# Patient Record
Sex: Male | Born: 1990 | Race: Black or African American | Hispanic: No | Marital: Single | Smoking: Never smoker
Health system: Southern US, Community
[De-identification: ages and names within clinical notes are randomized; demographics above are authoritative.]

## PROBLEM LIST (undated history)

## (undated) DIAGNOSIS — I10 Essential (primary) hypertension: Secondary | ICD-10-CM

## (undated) DIAGNOSIS — M109 Gout, unspecified: Secondary | ICD-10-CM

## (undated) DIAGNOSIS — J302 Other seasonal allergic rhinitis: Secondary | ICD-10-CM

## (undated) HISTORY — PX: WISDOM TOOTH EXTRACTION: SHX21

---
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
## 2012-07-16 ENCOUNTER — Emergency Department (INDEPENDENT_AMBULATORY_CARE_PROVIDER_SITE_OTHER)
Admission: EM | Admit: 2012-07-16 | Discharge: 2012-07-16 | Disposition: A | Discharge status: Home / Self Care | Payer: Self-pay | Source: Home / Self Care

## 2012-07-16 ENCOUNTER — Encounter (HOSPITAL_COMMUNITY): Payer: Self-pay | Admitting: Emergency Medicine

## 2012-07-16 DIAGNOSIS — W57XXXA Bitten or stung by nonvenomous insect and other nonvenomous arthropods, initial encounter: Secondary | ICD-10-CM

## 2012-07-16 DIAGNOSIS — T148 Other injury of unspecified body region: Secondary | ICD-10-CM

## 2012-07-16 DIAGNOSIS — T7840XA Allergy, unspecified, initial encounter: Secondary | ICD-10-CM

## 2012-07-16 HISTORY — DX: Essential (primary) hypertension: I10

## 2012-07-16 MED ORDER — PERMETHRIN 5 % EX CREA: TOPICAL_CREAM | CUTANEOUS | Status: DC

## 2012-07-16 MED ORDER — TRIAMCINOLONE ACETONIDE 0.1 % EX CREA: TOPICAL_CREAM | Freq: Two times a day (BID) | CUTANEOUS | Status: DC

## 2012-07-16 NOTE — ED Provider Notes (Signed)
History     CSN: [id number]  Arrival date & time 07/16/12  1126   First MD Initiated Contact with Patient 07/16/12 1230      Chief Complaint  Patient presents with  . Allergic Reaction    (Consider location/radiation/quality/duration/timing/severity/associated sxs/prior treatment) HPI Comments: 22 year old male presents with a two-day history of itchy red papules all the upper extremities. He states this started after he had helped his older family member moving furniture in her house. She told him that she had recently discovered bed bugs in her house. The patient has red slightly raised and pruritic lesions to the upper extremities. There are no pustules. There are no other lesions to the torso, head, neck or lower extremities. He denies constitutional symptoms and states he feels generally well. He has been applying witch hazel and OTC hydrocortisone with modest relief.   Past Medical History  Diagnosis Date  . Hypertension     History reviewed. No pertinent past surgical history.  No family history on file.  History  Substance Use Topics  . Smoking status: Never Smoker   . Smokeless tobacco: Not on file  . Alcohol Use: No      Review of Systems  All other systems reviewed and are negative.    Allergies  Review of patient's allergies indicates not on file.  Home Medications   Current Outpatient Rx  Name  Route  Sig  Dispense  Refill  . permethrin (ELIMITE) 5 % cream      Apply to neck and body once. Rinse off 8 hours.   60 g   0   . triamcinolone cream (KENALOG) 0.1 %   Topical   Apply topically 2 (two) times daily.   30 g   0     BP 150/98  Pulse 61  Temp(Src) 97.4 F (36.3 C) (Oral)  Resp 20  SpO2 99%  Physical Exam  Nursing note and vitals reviewed. Constitutional: He is oriented to person, place, and time. He appears well-developed and well-nourished. No distress.  Eyes: Conjunctivae and EOM are normal.  Neck: Normal range of motion.  Neck supple.  Cardiovascular: Normal rate.   Pulmonary/Chest: Effort normal.  Musculoskeletal: Normal range of motion.  Neurological: He is alert and oriented to person, place, and time.  Skin: Skin is warm and dry.  See description in distribution of lesions in the history of present illness.  Psychiatric: He has a normal mood and affect.    ED Course  Procedures (including critical care time)  Labs Reviewed - No data to display No results found.   1. Insect bites   2. Allergy to mites, initial encounter       MDM  Patient is discharged in stable condition. He has no constitutional symptoms. We will treat with Elimite cream for 1 application. Triamcinolone 0.1% cream to apply to bites twice a day. He may take Benadryl 50 mg (his choice) every 4-6 hours when necessary itching Clean and close and closing in case you have any this patient .                     Hayden Rasmussen, NP 07/16/12 [phone]

## 2012-07-16 NOTE — ED Provider Notes (Signed)
Medical screening examination/treatment/procedure(s) were performed by resident physician or non-physician practitioner and as supervising physician I was immediately available for consultation/collaboration.   KINDL,JAMES DOUGLAS MD.   James D Kindl, MD 07/16/12 1511 

## 2012-07-16 NOTE — ED Notes (Signed)
C/o rash, allergic reaction since Sunday.  Patient has been using hydrocortisone 1% since yesterday

## 2013-05-27 ENCOUNTER — Encounter (HOSPITAL_COMMUNITY): Payer: Self-pay | Admitting: Emergency Medicine

## 2013-05-27 ENCOUNTER — Emergency Department (HOSPITAL_COMMUNITY): Payer: BC Managed Care – PPO

## 2013-05-27 ENCOUNTER — Emergency Department (HOSPITAL_COMMUNITY)
Admission: EM | Admit: 2013-05-27 | Discharge: 2013-05-27 | Disposition: A | Discharge status: Home / Self Care | Payer: BC Managed Care – PPO | Attending: Emergency Medicine | Admitting: Emergency Medicine

## 2013-05-27 DIAGNOSIS — R0789 Other chest pain: Secondary | ICD-10-CM | POA: Insufficient documentation

## 2013-05-27 DIAGNOSIS — R079 Chest pain, unspecified: Secondary | ICD-10-CM

## 2013-05-27 DIAGNOSIS — I10 Essential (primary) hypertension: Secondary | ICD-10-CM | POA: Insufficient documentation

## 2013-05-27 LAB — CBC
HCT: 48 % (ref 39.0–52.0)
Hemoglobin: 16.4 g/dL (ref 13.0–17.0)
MCHC: 34.2 g/dL (ref 30.0–36.0)
MCV: 85.3 fL (ref 78.0–100.0)
RDW: 13 % (ref 11.5–15.5)
WBC: 8.8 10*3/uL (ref 4.0–10.5)

## 2013-05-27 LAB — BASIC METABOLIC PANEL
BUN: 15 mg/dL (ref 6–23)
Chloride: 98 mEq/L (ref 96–112)
Creatinine, Ser: 0.94 mg/dL (ref 0.50–1.35)
GFR calc Af Amer: >90 mL/min (ref >90)
Glucose, Bld: 88 mg/dL (ref 70–99)
Potassium: 4.8 mEq/L (ref 3.7–5.3)

## 2013-05-27 MED ORDER — IBUPROFEN 800 MG PO TABS: 800.0000 mg | ORAL_TABLET | Freq: Three times a day (TID) | ORAL | Status: DC | PRN

## 2013-05-27 NOTE — ED Provider Notes (Signed)
Medical screening examination/treatment/procedure(s) were performed by non-physician practitioner and as supervising physician I was immediately available for consultation/collaboration.  EKG Interpretation    Date/Time:  Tuesday May 27 2013 17:58:47 EST Ventricular Rate:  80 PR Interval:  141 QRS Duration: 88 QT Interval:  350 QTC Calculation: 404 R Axis:   18 Text Interpretation:  Sinus rhythm Normal ECG No previous tracing Confirmed by   MD-J,  ([id number]) on 05/27/2013 6:07:44 PM              Celene Kras, MD 05/27/13 [phone]

## 2013-05-27 NOTE — ED Provider Notes (Signed)
CSN: [id number]     Arrival date & time 05/27/13  1725 History   First MD Initiated Contact with Patient 05/27/13 1756     Chief Complaint  Patient presents with  . Chest Pain   (Consider location/radiation/quality/duration/timing/severity/associated sxs/prior Treatment) HPI Comments: Carlos Oneal is a 22 y.o. year-old male with a past medical history of HTN, presenting the Emergency Department with a chief complaint of 3 episodes of chest pain since yesterday.  The patient describes the discomfort as non-radiating, substernal, sharp pain lasting 35-45 seconds.  He reports onset of lifting things above his head at work and while drive and turning the steering wheel.  Denis palpations or missed beats. He denies nausea, dyspnea, cough, or abdominal pain. Reports negative family history of MI in father or mother, denies history of DM.  Denies cocaine use, reports occasional EtOH use. No PCP   The history is provided by the patient. No language interpreter was used.    Past Medical History  Diagnosis Date  . Hypertension    Past Surgical History  Procedure Laterality Date  . Wisdom tooth extraction     No family history on file. History  Substance Use Topics  . Smoking status: Never Smoker   . Smokeless tobacco: Not on file  . Alcohol Use: Yes     Comment: occasional    Review of Systems  Constitutional: Negative for fever, chills and diaphoresis.  Respiratory: Positive for chest tightness. Negative for cough, shortness of breath and wheezing.   Cardiovascular: Positive for chest pain. Negative for palpitations and leg swelling.  Gastrointestinal: Negative for nausea, vomiting, abdominal pain, diarrhea and constipation.  Neurological: Negative for syncope, light-headedness and numbness.    Allergies  Review of patient's allergies indicates no known allergies.  Home Medications   Current Outpatient Rx  Name  Route  Sig  Dispense  Refill  . ibuprofen (ADVIL,MOTRIN)  800 MG tablet   Oral   Take 1 tablet (800 mg total) by mouth 3 (three) times daily with meals as needed.   21 tablet   0    BP 149/90  Pulse 67  Temp(Src) 98.5 F (36.9 C) (Oral)  Resp 16  Ht 6\' 2"  (1.88 m)  Wt 270 lb (122.471 kg)  BMI 34.65 kg/m2  SpO2 100% Physical Exam  Nursing note and vitals reviewed. Constitutional: He is oriented to person, place, and time. He appears well-developed and well-nourished.  HENT:  Head: Normocephalic and atraumatic.  Neck: Neck supple.  Cardiovascular: Normal rate, regular rhythm and normal heart sounds.   Pulmonary/Chest: Effort normal and breath sounds normal. He has no wheezes. He has no rales.  Abdominal: Soft. There is no tenderness.  Neurological: He is alert and oriented to person, place, and time.  Skin: Skin is warm.    ED Course  Procedures (including critical care time) Labs Review Labs Reviewed  CBC  BASIC METABOLIC PANEL  POCT I-STAT TROPONIN I   Imaging Review Dg Chest 2 View  05/27/2013   CLINICAL DATA:  Mid left chest pain  EXAM: CHEST  2 VIEW  COMPARISON:  None.  FINDINGS: The heart size and mediastinal contours are within normal limits. Both lungs are clear. The visualized skeletal structures are unremarkable.  IMPRESSION: No active cardiopulmonary disease.   Electronically Signed   By: Elige Ko   On: 05/27/2013 19:03    EKG Interpretation    Date/Time:  Tuesday May 27 2013 17:58:47 EST Ventricular Rate:  80 PR Interval:  141 QRS  Duration: 88 QT Interval:  350 QTC Calculation: 404 R Axis:   18 Text Interpretation:  Sinus rhythm Normal ECG No previous tracing Confirmed by KNAPP  MD-J, JON ([id number]) on 05/27/2013 6:07:44 PM            MDM   1. Chest pain    Pt with 3 episodes of chest pain lasting 35-45 seconds.  Likely atypical chest pain. Discussed patient history, condition, and labs with Dr. Lynelle Doctor who agrees the patient can be evaluated as an out-pt. Discussed lab results, imaging results,  and treatment plan with the patient. Return precautions given. Reports understanding and no other concerns at this time.  Patient is stable for discharge at this time.  Meds given in ED:  Medications - No data to display  Discharge Medication List as of 05/27/2013  7:32 PM    START taking these medications   Details  ibuprofen (ADVIL,MOTRIN) 800 MG tablet Take 1 tablet (800 mg total) by mouth 3 (three) times daily with meals as needed., Starting 05/27/2013, Until Discontinued, Print          Clabe Seal, PA-C 05/27/13 1948

## 2013-05-27 NOTE — ED Notes (Signed)
Pt reports onset central chest pain/ tenderness yesterday at work while pt was moving around, pain went away after work. Pain came back today while pt was at work. Pt reports chest feels "sore and tender." Denies SOB, no N/V.

## 2013-08-20 ENCOUNTER — Encounter (HOSPITAL_COMMUNITY): Payer: Self-pay | Admitting: Emergency Medicine

## 2013-08-20 ENCOUNTER — Emergency Department (HOSPITAL_COMMUNITY)
Admission: EM | Admit: 2013-08-20 | Discharge: 2013-08-20 | Disposition: A | Discharge status: Home / Self Care | Payer: BC Managed Care – PPO | Attending: Emergency Medicine | Admitting: Emergency Medicine

## 2013-08-20 DIAGNOSIS — L723 Sebaceous cyst: Secondary | ICD-10-CM

## 2013-08-20 DIAGNOSIS — I10 Essential (primary) hypertension: Secondary | ICD-10-CM | POA: Insufficient documentation

## 2013-08-20 NOTE — ED Notes (Signed)
Pt presents with c/o lower and upper left back pain. Pt says earlier today he felt a knot in the middle of his back, new to him. Pt denies any injury to his back but he does heavy labor for work.

## 2013-08-20 NOTE — ED Provider Notes (Signed)
CSN: [id number]     Arrival date & time 08/20/13  1341 History   Carlos chart was scribed for non-physician practitioner working with Marlon Peliffany , by Tana ConchStephen Methvin ED Scribe. Carlos Oneal was seen in room WTR8/WTR8 and the Oneal's care was started at 2:15 PM.    Chief Complaint  Oneal presents with  . Back Pain     The history is provided by the Oneal. No language interpreter was used.   HPI Comments: Carlos Oneal is a Carlos y.o. male who presents to the Emergency Department complaining of a knot in his back that Carlos Oneal discovered Carlos morning. Carlos Oneal reports that Carlos Oneal has associated, constant back pain that has been present since yesterday. Carlos Oneal reports upon finding the knot in his back, Carlos Oneal decided to come into the ED. Carlos Oneal denies tenderness to the "knot". Carlos Oneal works at [employer] where Carlos Oneal does a lot of heavy lifting.    Past Medical History  Diagnosis Date  . Hypertension    Past Surgical History  Procedure Laterality Date  . Wisdom tooth extraction     No family history on file. History  Substance Use Topics  . Smoking status: Never Smoker   . Smokeless tobacco: Not on file  . Alcohol Use: Yes     Comment: occasional    Review of Systems  Constitutional: Positive for activity change. Negative for fever.  Musculoskeletal: Positive for back pain.  All other systems reviewed and are negative.      Allergies  Review of Oneal's allergies indicates no known allergies.  Home Medications   Current Outpatient Rx  Name  Route  Sig  Dispense  Refill  . ibuprofen (ADVIL,MOTRIN) 800 MG tablet   Oral   Take 1 tablet (800 mg total) by mouth 3 (three) times daily with meals as needed.   21 tablet   0    BP 144/86  Pulse 72  Temp(Src) 98.2 F (36.8 C) (Oral)  Resp 17  SpO2 99% Physical Exam  Nursing note and vitals reviewed. Constitutional: Carlos Oneal appears well-developed and well-nourished. No distress.  HENT:  Head: Normocephalic and atraumatic.  Eyes: Pupils are  equal, round, and reactive to light.  Neck: Normal range of motion. Neck supple.  Cardiovascular: Normal rate and regular rhythm.   Pulmonary/Chest: Effort normal.  Abdominal: Soft.  Musculoskeletal:       Back:  Neurological: Carlos Oneal is alert.  Skin: Skin is warm and dry.    ED Course  Procedures (including critical care time)  DIAGNOSTIC STUDIES: Oxygen Saturation is 99% on RA, normal by my interpretation.    COORDINATION OF CARE:  2:18 PM-Discussed treatment plan which includes monitoring the cyst with Carlos Oneal at bedside and Carlos Oneal agreed to plan.      Labs Review Labs Reviewed - No data to display Imaging Review No results found.   EKG Interpretation None      MDM   Final diagnoses:  Sebaceous cyst   Carlos Oneal has some mild musculoskeletal back pain which Carlos Oneal reports getting intermittently. Carlos is not the reasons for his visit. Carlos Oneal is concerned about the knot in his back that Carlos Oneal found. No emergent findings noted. If it starts to fast ly grow, become tender, painful or hot, become "bumpy", then Carlos Oneal needs to be seen ASAP. Given surgery referral if Carlos Oneal wants it removed for cosmetic purposes.  Carlos Oneal's evaluation in the Emergency Department is complete. It has been determined that no acute conditions requiring further emergency intervention are present  at Carlos time. The Oneal/guardian have been advised of the diagnosis and plan. We have discussed signs and symptoms that warrant return to the ED, such as changes or worsening in symptoms.  Vital signs are stable at discharge. Filed Vitals:   08/20/13 1405  BP: 144/86  Pulse: 72  Temp: 98.2 F (36.8 C)  Resp: 17    Oneal/guardian has voiced understanding and agreed to follow-up with the PCP or specialist.  I personally performed the services described in Carlos documentation, which was scribed in my presence. The recorded information has been reviewed and is accurate.        Dorthula Matas, PA-C 08/20/13  1428

## 2013-08-20 NOTE — Discharge Instructions (Signed)

## 2013-08-20 NOTE — ED Provider Notes (Signed)
Medical screening examination/treatment/procedure(s) were performed by non-physician practitioner and as supervising physician I was immediately available for consultation/collaboration.   EKG Interpretation None         , MD 08/20/13 1544 

## 2017-01-28 ENCOUNTER — Ambulatory Visit
Admission: EM | Admit: 2017-01-28 | Discharge: 2017-01-28 | Disposition: A | Discharge status: Home / Self Care | Payer: 59 | Attending: Family Medicine | Admitting: Family Medicine

## 2017-01-28 ENCOUNTER — Encounter: Payer: Self-pay | Admitting: *Deleted

## 2017-01-28 DIAGNOSIS — R21 Rash and other nonspecific skin eruption: Secondary | ICD-10-CM | POA: Diagnosis not present

## 2017-01-28 DIAGNOSIS — W57XXXA Bitten or stung by nonvenomous insect and other nonvenomous arthropods, initial encounter: Secondary | ICD-10-CM | POA: Diagnosis not present

## 2017-01-28 MED ORDER — TRIAMCINOLONE ACETONIDE 0.1 % EX CREA: 1.0000 "application " | TOPICAL_CREAM | Freq: Two times a day (BID) | CUTANEOUS | 0 refills | Status: DC

## 2017-01-28 MED ORDER — CETIRIZINE HCL 10 MG PO TABS: 10.0000 mg | ORAL_TABLET | Freq: Every day | ORAL | 0 refills | Status: DC

## 2017-01-28 NOTE — ED Triage Notes (Signed)
Rash to both arms and hips. Onset yesterday.

## 2017-01-28 NOTE — ED Provider Notes (Signed)
[facility]    CSN: [id number] Arrival date & time: 01/28/17  [id number]     History   Chief Complaint Chief Complaint  Patient presents with  . Rash    HPI Carlos Oneal is a 26 y.o. male.   HPI  This a 26 year old male who presents with a bumpy rash that is itchy on both of his arms and over his hips laterally. Started yesterday. He is visiting a friend who has a poodle dog that was rubbing up against him and sitting on his lap and also remembers staying outdoors in a grassy area for a long period of time. Afterwards his when he noted the rash that appeared as bites. Is on both of his arms minimally but more concentrated on both of his hips laterally. There are no further signs on his hands face neck groin lower legs or feet. He has had pediculosis in the past that this is  not like that at all.        Past Medical History:  Diagnosis Date  . Hypertension     There are no active problems to display for this patient.   Past Surgical History:  Procedure Laterality Date  . WISDOM TOOTH EXTRACTION         Home Medications    Prior to Admission medications   Medication Sig Start Date End Date Taking? Authorizing Provider  amLODipine (NORVASC) 10 MG tablet Take 10 mg by mouth daily.   Yes [provider]  cetirizine (ZYRTEC ALLERGY) 10 MG tablet Take 1 tablet (10 mg total) by mouth daily. 01/28/17   Lutricia Feiloemer,  P, PA-C  ibuprofen (ADVIL,MOTRIN) 800 MG tablet Take 1 tablet (800 mg total) by mouth 3 (three) times daily with meals as needed. 05/27/13   Mellody DrownParker, Lauren, PA-C  triamcinolone cream (KENALOG) 0.1 % Apply 1 application topically 2 (two) times daily. 01/28/17   Lutricia Feiloemer,  P, PA-C    Family History History reviewed. No pertinent family history.  Social History Social History  Substance Use Topics  . Smoking status: Never Smoker  . Smokeless tobacco: Never Used  . Alcohol use Yes     Comment: occasional     Allergies     Patient has no known allergies.   Review of Systems Review of Systems  Constitutional: Negative for activity change, chills, fatigue and fever.  Skin: Positive for rash.  All other systems reviewed and are negative.    Physical Exam Triage Vital Signs ED Triage Vitals  Enc Vitals Group     BP 01/28/17 1004 (!) 160/110     Pulse Rate 01/28/17 1002 80     Resp 01/28/17 1002 16     Temp 01/28/17 1002 98.2 F (36.8 C)     Temp Source 01/28/17 1002 Oral     SpO2 01/28/17 1002 99 %     Weight 01/28/17 1003 (!) 340 lb (154.2 kg)     Height 01/28/17 1003 6\' 2"  (1.88 m)     Head Circumference --      Peak Flow --      Pain Score --      Pain Loc --      Pain Edu? --      Excl. in GC? --    No data found.   Updated Vital Signs BP (!) 160/110 (BP Location: Left Arm)   Pulse 80   Temp 98.2 F (36.8 C) (Oral)   Resp 16   Ht 6\' 2"  (1.88 m)  Wt (!) 340 lb (154.2 kg)   SpO2 99%   BMI 43.65 kg/m   Visual Acuity Right Eye Distance:   Left Eye Distance:   Bilateral Distance:    Right Eye Near:   Left Eye Near:    Bilateral Near:     Physical Exam  Constitutional: He is oriented to person, place, and time. He appears well-developed and well-nourished. No distress.  HENT:  Head: Normocephalic.  Eyes: Pupils are equal, round, and reactive to light.  Neck: Normal range of motion.  Musculoskeletal: Normal range of motion.  Neurological: He is alert and oriented to person, place, and time.  Skin: Skin is warm and dry. Rash noted. He is not diaphoretic. There is erythema.  Refer to attached photographs for detail. The diffusion is on both lateral hips and on his arms not above the elbow. There is no involvement of his hands. He has no interdigital lesions. There is no burrowing seen  Psychiatric: He has a normal mood and affect. His behavior is normal. Judgment and thought content normal.  Nursing note and vitals reviewed.            UC Treatments / Results   Labs (all labs ordered are listed, but only abnormal results are displayed) Labs Reviewed - No data to display  EKG  EKG Interpretation None       Radiology No results found.  Procedures Procedures (including critical care time)  Medications Ordered in UC Medications - No data to display   Initial Impression / Assessment and Plan / UC Course  I have reviewed the triage vital signs and the nursing notes.  Pertinent labs & imaging results that were available during my care of the patient were reviewed by me and considered in my medical decision making (see chart for details).     Plan: 1. Test/x-ray results and diagnosis reviewed with patient 2. rx as per orders; risks, benefits, potential side effects reviewed with patient 3. Recommend supportive treatment with topical triamcinolone cream and also oral tech for itching.  If It is not improving in a week or so he should follow-up with a dermatologist. 4. F/u prn if symptoms worsen or don't improve   Final Clinical Impressions(s) / UC Diagnoses   Final diagnoses:  Insect bite, initial encounter    New Prescriptions Discharge Medication List as of 01/28/2017 10:33 AM    START taking these medications   Details  cetirizine (ZYRTEC ALLERGY) 10 MG tablet Take 1 tablet (10 mg total) by mouth daily., Starting Sun 01/28/2017, Normal    triamcinolone cream (KENALOG) 0.1 % Apply 1 application topically 2 (two) times daily., Starting Sun 01/28/2017, Normal         Controlled Substance Prescriptions [place] Controlled Substance Registry consulted? Not Applicable   Lutricia Feil, PA-C 01/28/17 1708

## 2017-08-09 ENCOUNTER — Encounter: Payer: Self-pay | Admitting: Emergency Medicine

## 2017-08-09 ENCOUNTER — Other Ambulatory Visit: Payer: Self-pay

## 2017-08-09 ENCOUNTER — Ambulatory Visit
Admission: EM | Admit: 2017-08-09 | Discharge: 2017-08-09 | Disposition: A | Discharge status: Home / Self Care | Payer: 59 | Attending: Family Medicine | Admitting: Family Medicine

## 2017-08-09 DIAGNOSIS — B029 Zoster without complications: Secondary | ICD-10-CM

## 2017-08-09 HISTORY — DX: Other seasonal allergic rhinitis: J30.2

## 2017-08-09 MED ORDER — VALACYCLOVIR HCL 1 G PO TABS: 1000.0000 mg | ORAL_TABLET | Freq: Three times a day (TID) | ORAL | 0 refills | Status: DC

## 2017-08-09 NOTE — ED Provider Notes (Signed)
[facility]    CSN: [id number] Arrival date & time: 08/09/17  1130  History   Chief Complaint Chief Complaint  Patient presents with  . Rash   HPI  27 year old male presents with rash.  Patient states that on Sunday he noticed a rash on the right side of his neck.  The he states that it appears to be "blisters".  Associated pain.  He states that he is recently gotten his hair cut and has been using a beard oil.  No new changes that he is aware of.  No fevers or chills.  No other associated symptoms.  Pain is 7/10 in severity.  No exacerbating/relieving factors.  No other complaints.  Past Medical History:  Diagnosis Date  . Hypertension   . Seasonal allergies    Past Surgical History:  Procedure Laterality Date  . WISDOM TOOTH EXTRACTION      Home Medications    Prior to Admission medications   Medication Sig Start Date End Date Taking? Authorizing Provider  amLODipine (NORVASC) 10 MG tablet Take 10 mg by mouth daily.   Yes [provider]  losartan (COZAAR) 50 MG tablet Take 50 mg by mouth daily.   Yes [provider]  cetirizine (ZYRTEC ALLERGY) 10 MG tablet Take 1 tablet (10 mg total) by mouth daily. 01/28/17   Lutricia Feil, PA-C  valACYclovir (VALTREX) 1000 MG tablet Take 1 tablet (1,000 mg total) by mouth 3 (three) times daily. 08/09/17   Tommie Sams, DO    Family History Family History  Problem Relation Age of Onset  . Healthy Mother   . Hypertension Father   . Deep vein thrombosis Father     Social History Social History   Tobacco Use  . Smoking status: Never Smoker  . Smokeless tobacco: Never Used  Substance Use Topics  . Alcohol use: Yes    Comment: occasional  . Drug use: No     Allergies   Patient has no known allergies.   Review of Systems Review of Systems  Constitutional: Negative.   Skin: Positive for rash.   Physical Exam Triage Vital Signs ED Triage Vitals [08/09/17 1147]  Enc Vitals Group   BP (!) 147/106     Pulse Rate 94     Resp 16     Temp 98.6 F (37 C)     Temp Source Oral     SpO2 99 %     Weight (!) 330 lb (149.7 kg)     Height 6\' 1"  (1.854 m)     Head Circumference      Peak Flow      Pain Score 7     Pain Loc      Pain Edu?      Excl. in GC?    Updated Vital Signs BP (!) 143/76 (BP Location: Right Arm)   Pulse 94   Temp 98.6 F (37 C) (Oral)   Resp 16   Ht 6\' 1"  (1.854 m)   Wt (!) 330 lb (149.7 kg)   SpO2 99%   BMI 43.54 kg/m   Physical Exam  Constitutional: He is oriented to person, place, and time. He appears well-developed. No distress.  Cardiovascular: Normal rate and regular rhythm.  Pulmonary/Chest: Effort normal and breath sounds normal. He has no wheezes. He has no rales.  Neurological: He is alert and oriented to person, place, and time.  Skin:  Right side of the neck and right trapezius region with erythematous, vesicular  rash.  Psychiatric: He has a normal mood and affect. His behavior is normal.  Nursing note and vitals reviewed.  UC Treatments / Results  Labs (all labs ordered are listed, but only abnormal results are displayed) Labs Reviewed - No data to display  EKG  EKG Interpretation None       Radiology No results found.  Procedures Procedures (including critical care time)  Medications Ordered in UC Medications - No data to display   Initial Impression / Assessment and Plan / UC Course  I have reviewed the triage vital signs and the nursing notes.  Pertinent labs & imaging results that were available during my care of the patient were reviewed by me and considered in my medical decision making (see chart for details).     27 year old male presents with rash that appears to be consistent with herpes zoster.  Treating with Valtrex.  Final Clinical Impressions(s) / UC Diagnoses   Final diagnoses:  Herpes zoster without complication    ED Discharge Orders        Ordered    valACYclovir (VALTREX) 1000  MG tablet  3 times daily     08/09/17 1208     Controlled Substance Prescriptions [place] Controlled Substance Registry consulted? Not Applicable   Tommie SamsCook,  G, DO 08/09/17 1233

## 2017-08-09 NOTE — ED Triage Notes (Signed)
Patient in today c/o rash/blisters 2-3 days on the right side of his neck and face.

## 2017-08-10 ENCOUNTER — Telehealth: Payer: Self-pay | Admitting: Family Medicine

## 2017-08-10 MED ORDER — TRAMADOL HCL 50 MG PO TABS: 50.0000 mg | ORAL_TABLET | Freq: Three times a day (TID) | ORAL | 0 refills | Status: DC | PRN

## 2017-08-10 NOTE — Telephone Encounter (Signed)
Patient requesting Rx for pain regarding shingles Rx for tramadol sent.

## 2019-05-04 ENCOUNTER — Ambulatory Visit
Admission: EM | Admit: 2019-05-04 | Discharge: 2019-05-04 | Disposition: A | Discharge status: Home / Self Care | Payer: BC Managed Care – PPO | Attending: Urgent Care | Admitting: Urgent Care

## 2019-05-04 ENCOUNTER — Encounter: Payer: Self-pay | Admitting: Emergency Medicine

## 2019-05-04 ENCOUNTER — Other Ambulatory Visit: Payer: Self-pay

## 2019-05-04 DIAGNOSIS — I10 Essential (primary) hypertension: Secondary | ICD-10-CM

## 2019-05-04 DIAGNOSIS — J029 Acute pharyngitis, unspecified: Secondary | ICD-10-CM

## 2019-05-04 LAB — RAPID STREP SCREEN (MED CTR MEBANE ONLY): Streptococcus, Group A Screen (Direct): NEGATIVE

## 2019-05-04 NOTE — ED Provider Notes (Signed)
[facility]    CSN: [id number] Arrival date & time: 05/04/19  0915      History   Chief Complaint Chief Complaint  Patient presents with  . Sore Throat    HPI Carlos Oneal is a 28 y.o. male.   HPI   28 year old male presents with a sore throat that he has had since Monday 6 days prior to this visit.  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  He has recently returned from [place].  Denies any cough has had no shortness of breath has had no fatigue denies any nasal congestion or sinus drainage.  His only complaint is that of a tickle in his throat.  He does not smoke.  Past Medical History:  Diagnosis Date  . Hypertension   . Seasonal allergies     There are no active problems to display for this patient.   Past Surgical History:  Procedure Laterality Date  . WISDOM TOOTH EXTRACTION         Home Medications    Prior to Admission medications   Medication Sig Start Date End Date Taking? Authorizing Provider  amLODipine (NORVASC) 10 MG tablet Take 10 mg by mouth daily.   Yes [provider]  losartan (COZAAR) 50 MG tablet Take 50 mg by mouth daily.   Yes [provider]  valACYclovir (VALTREX) 1000 MG tablet Take 1 tablet (1,000 mg total) by mouth 3 (three) times daily. 08/09/17   Coral Spikes, DO  cetirizine (ZYRTEC ALLERGY) 10 MG tablet Take 1 tablet (10 mg total) by mouth daily. 01/28/17 05/04/19  Lorin Picket, PA-C    Family History Family History  Problem Relation Age of Onset  . Healthy Mother   . Hypertension Father   . Deep vein thrombosis Father     Social History Social History   Tobacco Use  . Smoking status: Never Smoker  . Smokeless tobacco: Never Used  Substance Use Topics  . Alcohol use: Yes    Comment: occasional  . Drug use: No     Allergies   Patient has no known allergies.   Review of Systems Review of Systems  Constitutional: Negative for activity change, appetite change,  chills, fatigue and fever.  HENT: Positive for sore throat and trouble swallowing. Negative for congestion, ear pain, postnasal drip, rhinorrhea, sinus pressure, sinus pain and voice change.   Respiratory: Negative for cough, shortness of breath, wheezing and stridor.   All other systems reviewed and are negative.    Physical Exam Triage Vital Signs ED Triage Vitals  Enc Vitals Group     BP 05/04/19 0927 (!) 157/108     Pulse Rate 05/04/19 0927 95     Resp 05/04/19 0927 17     Temp 05/04/19 0927 98.7 F (37.1 C)     Temp Source 05/04/19 0927 Oral     SpO2 05/04/19 0927 97 %     Weight 05/04/19 0924 (!) 340 lb (154.2 kg)     Height 05/04/19 0924 6\' 2"  (1.88 m)     Head Circumference --      Peak Flow --      Pain Score 05/04/19 0924 7     Pain Loc --      Pain Edu? --      Excl. in [place]? --    No data found.  Updated Vital Signs BP (!) 143/100 (BP Location: Left Arm)   Pulse 95  Temp 98.7 F (37.1 C) (Oral)   Resp 17   Ht 6\' 2"  (1.88 m)   Wt (!) 340 lb (154.2 kg)   SpO2 97%   BMI 43.65 kg/m   Visual Acuity Right Eye Distance:   Left Eye Distance:   Bilateral Distance:    Right Eye Near:   Left Eye Near:    Bilateral Near:     Physical Exam Vitals signs and nursing note reviewed.  Constitutional:      General: He is not in acute distress.    Appearance: He is well-developed. He is obese. He is not ill-appearing, toxic-appearing or diaphoretic.  HENT:     Head: Normocephalic and atraumatic.     Mouth/Throat:     Mouth: Mucous membranes are moist. No oral lesions.     Pharynx: Oropharynx is clear. Uvula midline. No pharyngeal swelling, oropharyngeal exudate, posterior oropharyngeal erythema or uvula swelling.     Tonsils: No tonsillar exudate or tonsillar abscesses. 0 on the right. 0 on the left.  Eyes:     Conjunctiva/sclera: Conjunctivae normal.  Neck:     Musculoskeletal: Normal range of motion and neck supple.  Cardiovascular:     Rate and Rhythm:  Normal rate and regular rhythm.     Heart sounds: Normal heart sounds.  Pulmonary:     Effort: Pulmonary effort is normal.     Breath sounds: Normal breath sounds.  Skin:    General: Skin is warm and dry.  Neurological:     General: No focal deficit present.     Mental Status: He is alert and oriented to person, place, and time.  Psychiatric:        Mood and Affect: Mood normal.        Behavior: Behavior normal.      UC Treatments / Results  Labs (all labs ordered are listed, but only abnormal results are displayed) Labs Reviewed  RAPID STREP SCREEN ([facility] ONLY)  NOVEL CORONAVIRUS, NAA (HOSP ORDER, SEND-OUT TO REF LAB; TAT 18-24 HRS)  CULTURE, GROUP A STREP [facility])    EKG   Radiology No results found.  Procedures Procedures (including critical care time)  Medications Ordered in UC Medications - No data to display  Initial Impression / Assessment and Plan / UC Course  I have reviewed the triage vital signs and the nursing notes.  Pertinent labs & imaging results that were available during my care of the patient were reviewed by me and considered in my medical decision making (see chart for details).   This patient presented with sore throat which she described more as a tickle.  Also found to have an elevated blood pressure.  He does have history of hypertension but usually takes his medications late at night.  Repeat of his blood pressure showed that it had decreased somewhat while here.  He was tested for COVID-19.  In that regard I will keep him in quarantine until the results are available.  In the meantime we will treat his sore throat symptomatically.  He will need to follow-up with his primary care physician regarding his hypertension for possible better control.   Final Clinical Impressions(s) / UC Diagnoses   Final diagnoses:  Sore throat  Essential hypertension     Discharge Instructions     Be sure to take your blood pressure medication as  prescribed.  Use salt water gargles as we discussed.  Use 1/2 to 1 teaspoon in 8 ounces of warm water as necessary.  Quarantine for the next 2 to 3 days until results of the Covid testing are available.    ED Prescriptions    None     PDMP not reviewed this encounter.   Lutricia Feil, PA-C 05/04/19 1655

## 2019-05-04 NOTE — Discharge Instructions (Addendum)
Be sure to take your blood pressure medication as prescribed.  Use salt water gargles as we discussed.  Use 1/2 to 1 teaspoon in 8 ounces of warm water as necessary.  Quarantine for the next 2 to 3 days until results of the Covid testing are available.

## 2019-05-04 NOTE — ED Triage Notes (Signed)
Patient c/o Has and sore throat since Monday.  Patient denies fevers.

## 2019-05-05 LAB — NOVEL CORONAVIRUS, NAA (HOSP ORDER, SEND-OUT TO REF LAB; TAT 18-24 HRS): SARS-CoV-2, NAA: NOT DETECTED

## 2019-05-07 LAB — CULTURE, GROUP A STREP (THRC)

## 2019-05-24 ENCOUNTER — Encounter: Payer: Self-pay | Admitting: Emergency Medicine

## 2019-05-24 ENCOUNTER — Ambulatory Visit
Admission: EM | Admit: 2019-05-24 | Discharge: 2019-05-24 | Disposition: A | Discharge status: Home / Self Care | Payer: BC Managed Care – PPO | Attending: Internal Medicine | Admitting: Internal Medicine

## 2019-05-24 ENCOUNTER — Ambulatory Visit (INDEPENDENT_AMBULATORY_CARE_PROVIDER_SITE_OTHER): Payer: BC Managed Care – PPO

## 2019-05-24 ENCOUNTER — Other Ambulatory Visit: Payer: Self-pay

## 2019-05-24 DIAGNOSIS — M79671 Pain in right foot: Secondary | ICD-10-CM

## 2019-05-24 DIAGNOSIS — S96911A Strain of unspecified muscle and tendon at ankle and foot level, right foot, initial encounter: Secondary | ICD-10-CM

## 2019-05-24 MED ORDER — IBUPROFEN 400 MG PO TABS: ORAL_TABLET | ORAL | 0 refills | Status: DC

## 2019-05-24 NOTE — ED Triage Notes (Signed)
Patient states that he tripped and injured his right foot a week ago.  Patient c/o pain across the top of his right foot that has not improved.

## 2019-05-24 NOTE — ED Provider Notes (Signed)
[facility]    CSN: [id number] Arrival date & time: 05/24/19  0846      History   Chief Complaint Chief Complaint  Patient presents with  . Foot Pain    right    HPI Carlos Oneal is a 28 y.o. male. who presents with R dorsal foot pain which started one week ago when he almost fell on flat surface but he cough himself.  Yesterday got swollen and could not bare wt. He iced it and elevated it, and took Ibuprofen 800 mg twice.  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 Denies paresthesia, but has some tingling.     Past Medical History:  Diagnosis Date  . Hypertension   . Seasonal allergies     There are no problems to display for this patient.   Past Surgical History:  Procedure Laterality Date  . WISDOM TOOTH EXTRACTION         Home Medications    Prior to Admission medications   Medication Sig Start Date End Date Taking? Authorizing Provider  amLODipine (NORVASC) 10 MG tablet Take 10 mg by mouth daily.   Yes [provider]  losartan (COZAAR) 50 MG tablet Take 50 mg by mouth daily.   Yes [provider]  ibuprofen (ADVIL) 400 MG tablet 1-2 q 6h prn foot pain. If BP > 140/90 while on this, then discontinue it. 05/24/19   Rodriguez-Southworth, Sunday Spillers, PA-C  valACYclovir (VALTREX) 1000 MG tablet Take 1 tablet (1,000 mg total) by mouth 3 (three) times daily. 08/09/17   Coral Spikes, DO  cetirizine (ZYRTEC ALLERGY) 10 MG tablet Take 1 tablet (10 mg total) by mouth daily. 01/28/17 05/04/19  Lorin Picket, PA-C    Family History Family History  Problem Relation Age of Onset  . Healthy Mother   . Hypertension Father   . Deep vein thrombosis Father     Social History Social History   Tobacco Use  . Smoking status: Never Smoker  . Smokeless tobacco: Never Used  Substance Use Topics  . Alcohol use: Yes    Comment: occasional  . Drug use: No      Allergies   Patient has no known allergies.   Review of Systems  + for R foot pain and some tingling of R dorsal foot. Denies redness, ecchymosis, hotness, abrasions. Has taken his BP med this am, he does not know what it normally runs since he does not check it.  States he feels well otherwise.   Physical Exam Triage Vital Signs ED Triage Vitals  Enc Vitals Group     BP 05/24/19 0858 (!) 156/109     Pulse Rate 05/24/19 0858 99     Resp 05/24/19 0858 16     Temp 05/24/19 0858 98.1 F (36.7 C)     Temp Source 05/24/19 0858 Oral     SpO2 05/24/19 0858 96 %     Weight 05/24/19 0856 (!) 330 lb (149.7 kg)     Height 05/24/19 0856 6\' 2"  (1.88 m)     Head Circumference --      Peak Flow --      Pain Score 05/24/19 0856 8     Pain Loc --      Pain Edu? --      Excl. in [place]? --    No data found.  Updated Vital Signs BP (!) 152/103 (BP  Location: Right Arm)   Pulse 99   Temp 98.1 F (36.7 C) (Oral)   Resp 16   Ht 6\' 2"  (1.88 m)   Wt (!) 330 lb (149.7 kg)   SpO2 96%   BMI 42.37 kg/m   Visual Acuity Right Eye Distance:   Left Eye Distance:   Bilateral Distance:    Right Eye Near:   Left Eye Near:    Bilateral Near:     Physical Exam Constitutional:      General: He is not in acute distress.    Appearance: He is obese. He is not toxic-appearing.  HENT:     Head: Atraumatic.     Right Ear: External ear normal.     Left Ear: External ear normal.     Nose: Nose normal.  Eyes:     General: No scleral icterus.    Conjunctiva/sclera: Conjunctivae normal.  Cardiovascular:     Rate and Rhythm: Regular rhythm.     Pulses: Normal pulses.     Heart sounds: No murmur.  Pulmonary:     Effort: Pulmonary effort is normal.     Breath sounds: Normal breath sounds.  Musculoskeletal:        General: Tenderness present. No deformity or signs of injury. Normal range of motion.     Cervical back: Neck supple.     Right lower leg: No edema.     Left lower leg: No edema.      Comments: R FOOT- has point tenderness of 2nd to 4th metatarsal region from dorsal palpation, but no pain with palpation from sole of foot. His toes are not tender to palpation.  R ANKLE- with no swelling or tenderness and has nl ROM.   Skin:    General: Skin is warm and dry.     Capillary Refill: Capillary refill takes less than 2 seconds.     Findings: No bruising, erythema, lesion or rash.  Neurological:     Mental Status: He is alert and oriented to person, place, and time.     Motor: No weakness.     Gait: Gait abnormal.     Comments: Walks with a limp  Psychiatric:        Mood and Affect: Mood normal.        Behavior: Behavior normal.        Thought Content: Thought content normal.        Judgment: Judgment normal.    UC Treatments / Results  Labs (all labs ordered are listed, but only abnormal results are displayed) Labs Reviewed - No data to display  EKG   Radiology DG Foot Complete Right  Result Date: 05/24/2019 CLINICAL DATA:  Fall 1 week ago with tenderness over the second through fourth metatarsals. EXAM: RIGHT FOOT COMPLETE - 3+ VIEW COMPARISON:  10/24/2010 FINDINGS: There is no evidence of fracture or dislocation. There is no evidence of arthropathy or other focal bone abnormality. Soft tissues are unremarkable. IMPRESSION: Negative. Electronically Signed   By: Elberta Fortisaniel  Boyle M.D.   On: 05/24/2019 09:38    Procedures Procedures (including critical care time)  Medications Ordered in UC Medications - No data to display  Initial Impression / Assessment and Plan / UC Course  I have reviewed the triage vital signs and the nursing notes. Pertinent  imaging results that were available during my care of the patient were reviewed by me and considered in my medical decision making (see chart for details).  He was placed on a  post op shoe and may take Tylenol 1000 mg q 6h and alternate with Ibuprofen if his BP is not >140/90. If BP still high, needs to d/c Ibuprofen  completely.  Wearing the post op shoe felt better. 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  Needs to FU with PCP next week.     Final Clinical Impressions(s) / UC Diagnoses   Final diagnoses:  Strain of right foot, initial encounter     Discharge Instructions     Avoid wearing flip flop or sandals. Wear the hard sole foot brace we gave you for one week. Follow up with your family Dr. Gwendolyn Fill then. Or [facility] if you cant get in to see your PCP.  So far I do not see a broken bone, but the final read is not done yet. If any changes happen, we will call you.     ED Prescriptions    Medication Sig Dispense Auth. Provider   ibuprofen (ADVIL) 400 MG tablet 1-2 q 6h prn foot pain. If BP > 140/90 while on this, then discontinue it. 30 tablet Rodriguez-Southworth, Nettie Elm, PA-C     PDMP not reviewed this encounter.   Garey Ham, PA-C 05/24/19 1010

## 2019-05-24 NOTE — Discharge Instructions (Addendum)
Avoid wearing flip flop or sandals. Wear the hard sole foot brace we gave you for one week. Follow up with your family Dr. Cleaster Corin then. Or [facility] if you cant get in to see your PCP.  So far I do not see a broken bone, but the final read is not done yet. If any changes happen, we will call you.

## 2019-05-26 ENCOUNTER — Emergency Department
Admission: EM | Admit: 2019-05-26 | Discharge: 2019-05-26 | Disposition: A | Discharge status: Home / Self Care | Payer: BC Managed Care – PPO | Attending: Emergency Medicine | Admitting: Emergency Medicine

## 2019-05-26 ENCOUNTER — Other Ambulatory Visit: Payer: Self-pay

## 2019-05-26 ENCOUNTER — Emergency Department: Payer: BC Managed Care – PPO

## 2019-05-26 ENCOUNTER — Encounter: Payer: Self-pay | Admitting: Emergency Medicine

## 2019-05-26 DIAGNOSIS — Z79899 Other long term (current) drug therapy: Secondary | ICD-10-CM | POA: Insufficient documentation

## 2019-05-26 DIAGNOSIS — R5383 Other fatigue: Secondary | ICD-10-CM | POA: Insufficient documentation

## 2019-05-26 DIAGNOSIS — I10 Essential (primary) hypertension: Secondary | ICD-10-CM | POA: Insufficient documentation

## 2019-05-26 DIAGNOSIS — G4700 Insomnia, unspecified: Secondary | ICD-10-CM | POA: Diagnosis not present

## 2019-05-26 DIAGNOSIS — R0602 Shortness of breath: Secondary | ICD-10-CM | POA: Diagnosis present

## 2019-05-26 LAB — BASIC METABOLIC PANEL
Anion gap: 10 (ref 5–15)
BUN: 21 mg/dL — ABNORMAL HIGH (ref 6–20)
CO2: 22 mmol/L (ref 22–32)
Calcium: 9.2 mg/dL (ref 8.9–10.3)
Chloride: 106 mmol/L (ref 98–111)
Creatinine, Ser: 1.08 mg/dL (ref 0.61–1.24)
GFR calc Af Amer: >60 mL/min (ref >60)
GFR calc non Af Amer: >60 mL/min (ref >60)
Glucose, Bld: 106 mg/dL — ABNORMAL HIGH (ref 70–99)
Potassium: 4.2 mmol/L (ref 3.5–5.1)
Sodium: 138 mmol/L (ref 135–145)

## 2019-05-26 LAB — CBC
HCT: 44.5 % (ref 39.0–52.0)
Hemoglobin: 14.6 g/dL (ref 13.0–17.0)
MCH: 28.2 pg (ref 26.0–34.0)
MCHC: 32.8 g/dL (ref 30.0–36.0)
MCV: 85.9 fL (ref 80.0–100.0)
Platelets: 355 10*3/uL (ref 150–400)
RBC: 5.18 MIL/uL (ref 4.22–5.81)
RDW: 13.2 % (ref 11.5–15.5)
WBC: 9.6 10*3/uL (ref 4.0–10.5)
nRBC: 0 % (ref 0.0–0.2)

## 2019-05-26 MED ORDER — NAPROXEN 500 MG PO TABS: 500.0000 mg | ORAL_TABLET | Freq: Two times a day (BID) | ORAL | 0 refills | Status: DC

## 2019-05-26 NOTE — ED Triage Notes (Signed)
Pt reports was tested for COVID 2 weeks ago when he was having symptoms and it was negative. Pt states since then has not been able to sleep at night but can sleep for short periods sometimes and sometimes he will get SOB. Denies pain or other sx's.

## 2019-05-26 NOTE — ED Notes (Signed)
No esign available at this time. Pt verbalized understanding.

## 2019-05-26 NOTE — ED Provider Notes (Signed)
[facility] Emergency Department Provider Note ____________________________________________   First MD Initiated Contact with Patient 05/26/19 1409     (approximate)  I have reviewed the triage vital signs and the nursing notes.   HISTORY  Chief Complaint cannot sleep and Shortness of Breath  HPI Carlos Oneal is a 28 y.o. male presents to the emergency department for treatment and evaluation of fatigue but inability to sleep.  He states that he was tested for COVID-19 2 weeks ago that was negative.  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  He states that he can only sleep short periods of time and occasionally feels short of breath.  He has no chest pain or cough.  He denies fever.         Past Medical History:  Diagnosis Date  . Hypertension   . Seasonal allergies     There are no problems to display for this patient.   Past Surgical History:  Procedure Laterality Date  . WISDOM TOOTH EXTRACTION      Prior to Admission medications   Medication Sig Start Date End Date Taking? Authorizing Provider  amLODipine (NORVASC) 10 MG tablet Take 10 mg by mouth daily.    [provider]  ibuprofen (ADVIL) 400 MG tablet 1-2 q 6h prn foot pain. If BP > 140/90 while on this, then discontinue it. 05/24/19   Rodriguez-Southworth, Nettie Elm, PA-C  losartan (COZAAR) 50 MG tablet Take 50 mg by mouth daily.    [provider]  naproxen (NAPROSYN) 500 MG tablet Take 1 tablet (500 mg total) by mouth 2 (two) times daily with a meal. 05/26/19   ,  B, FNP  valACYclovir (VALTREX) 1000 MG tablet Take 1 tablet (1,000 mg total) by mouth 3 (three) times daily. 08/09/17   Tommie Sams, DO  cetirizine (ZYRTEC ALLERGY) 10 MG tablet Take 1 tablet (10 mg total) by mouth daily. 01/28/17 05/04/19  Lutricia Feil, PA-C    Allergies Patient has no known  allergies.  Family History  Problem Relation Age of Onset  . Healthy Mother   . Hypertension Father   . Deep vein thrombosis Father     Social History Social History   Tobacco Use  . Smoking status: Never Smoker  . Smokeless tobacco: Never Used  Substance Use Topics  . Alcohol use: Yes    Comment: occasional  . Drug use: No    Review of Systems  Constitutional: No fever/chills Eyes: No visual changes. ENT: No sore throat. Cardiovascular: Denies chest pain. Respiratory: Occasional shortness of breath. Gastrointestinal: No abdominal pain.  No nausea, no vomiting.  No diarrhea.  No constipation. Genitourinary: Negative for dysuria. Musculoskeletal: Negative for back pain. Skin: Negative for rash. Neurological: Negative for headaches, focal weakness or numbness.  ____________________________________________   PHYSICAL EXAM:  VITAL SIGNS: ED Triage Vitals  Enc Vitals Group     BP 05/26/19 1213 (!) 157/118     Pulse Rate 05/26/19 1213 (!) 106     Resp 05/26/19 1213 20     Temp 05/26/19 1213 98.4 F (36.9 C)     Temp Source 05/26/19 1213 Oral     SpO2 05/26/19 1213 97 %     Weight 05/26/19 1211 (!) 330 lb (149.7 kg)     Height 05/26/19 1211 6\' 2"  (1.88 m)     Head Circumference --  Peak Flow --      Pain Score 05/26/19 1211 0     Pain Loc --      Pain Edu? --      Excl. in [place]? --     Constitutional: Alert and oriented. Well appearing and in no acute distress. Eyes: Conjunctivae are normal. PERRL. EOMI. Head: Atraumatic. Nose: No congestion/rhinnorhea. Mouth/Throat: Mucous membranes are moist.  Oropharynx non-erythematous. Neck: No stridor.   Hematological/Lymphatic/Immunilogical: No cervical lymphadenopathy. Cardiovascular: Normal rate, regular rhythm. Grossly normal heart sounds.  Good peripheral circulation. Respiratory: Normal respiratory effort.  No retractions. Lungs CTAB. Gastrointestinal: Soft and nontender. No distention. No abdominal bruits.  No CVA tenderness. Genitourinary:  Musculoskeletal: No lower extremity tenderness nor edema.  No joint effusions. Neurologic:  Normal speech and language. No gross focal neurologic deficits are appreciated. No gait instability. Skin:  Skin is warm, dry and intact. No rash noted. Psychiatric: Mood and affect are normal. Speech and behavior are normal.  ____________________________________________   LABS (all labs ordered are listed, but only abnormal results are displayed)  Labs Reviewed  BASIC METABOLIC PANEL - Abnormal; Notable for the following components:      Result Value   Glucose, Bld 106 (*)    BUN 21 (*)    All other components within normal limits  CBC   ____________________________________________  EKG  ED ECG REPORT I,  , FNP-BC personally viewed and interpreted this ECG.   Date: 05/26/2019  EKG Time: 1220  Rate: 107  Rhythm: sinus tachycardia  Axis: normal  Intervals:none  ST&T Change: no ST elevation  ____________________________________________  RADIOLOGY  ED MD interpretation:    Chest x-ray is negative for acute cardiopulmonary disease.  Official radiology report(s): DG Chest 2 View  Result Date: 05/26/2019 CLINICAL DATA:  Shortness of breath EXAM: CHEST - 2 VIEW COMPARISON:  05/27/2013 FINDINGS: The heart size and mediastinal contours are within normal limits. No focal airspace consolidation, pleural effusion, or pneumothorax. The visualized skeletal structures are unremarkable. IMPRESSION: No active cardiopulmonary disease. Electronically Signed   By: Davina Poke D.O.   On: 05/26/2019 12:45    ____________________________________________   PROCEDURES  Procedure(s) performed (including Critical Care):  Procedures  ____________________________________________   INITIAL IMPRESSION / ASSESSMENT AND PLAN     28 year old male presenting to the emergency department for treatment and evaluation of fatigue, inability to sleep,  and occasional shortness of breath.  While awaiting ER room assignment protocol testing was completed.  DIFFERENTIAL DIAGNOSIS  COVID-19, pneumonia, viral syndrome, insomnia  ED COURSE  EKG, chest x-ray, lab studies are all reassuring. The patient was advised to take Benadryl prior to bed. He was advised that he should follow-up with his primary care provider for hypertension.  He is not currently on any antihypertensive medications. ____________________________________________   FINAL CLINICAL IMPRESSION(S) / ED DIAGNOSES  Final diagnoses:  Fatigue, unspecified type  Insomnia, unspecified type  Hypertension, unspecified type     ED Discharge Orders         Ordered    naproxen (NAPROSYN) 500 MG tablet  2 times daily with meals     05/26/19 1417           Note:  This document was prepared using Dragon voice recognition software and may include unintentional dictation errors.   Victorino Dike, FNP 05/26/19 Erskin Burnet    Duffy Bruce, MD 05/27/19 1038

## 2019-06-30 ENCOUNTER — Encounter: Payer: Self-pay | Admitting: Emergency Medicine

## 2019-06-30 ENCOUNTER — Ambulatory Visit
Admission: EM | Admit: 2019-06-30 | Discharge: 2019-06-30 | Disposition: A | Discharge status: Home / Self Care | Payer: BC Managed Care – PPO | Attending: Family Medicine | Admitting: Family Medicine

## 2019-06-30 ENCOUNTER — Other Ambulatory Visit: Payer: Self-pay

## 2019-06-30 ENCOUNTER — Ambulatory Visit (INDEPENDENT_AMBULATORY_CARE_PROVIDER_SITE_OTHER): Payer: BC Managed Care – PPO

## 2019-06-30 DIAGNOSIS — M79671 Pain in right foot: Secondary | ICD-10-CM

## 2019-06-30 MED ORDER — MELOXICAM 15 MG PO TABS: 15.0000 mg | ORAL_TABLET | Freq: Every day | ORAL | 0 refills | Status: DC

## 2019-06-30 NOTE — Discharge Instructions (Addendum)
Apply ice 20 minutes out of every 2 hours 4-5 times daily for comfort.  Elevate as necessary to control swelling and pain.  Follow up with podiatry for further evaluation

## 2019-06-30 NOTE — ED Provider Notes (Signed)
[facility]    CSN: [id number] Arrival date & time: 06/30/19  1207      History   Chief Complaint Chief Complaint  Patient presents with   Foot Pain    right    HPI Carlos Oneal is a 29 y.o. male.   HPI  -year-old male presents with right dorsal foot pain and swelling.  He was seen here on 26 2020 for very similar presentation.  At that time x-rays did not show any significant pathology he was treated with a postop shoe and ibuprofen.  He tells me that he did improve from that time but over the last 4 days he has had a recurrence of his pain.  He does not remember any specific injury that may have precipitated his symptoms.  He states he has had swelling and pain.  He has been using ibuprofen which helps the swelling but not the pain interestingly.  Pain and swelling are more lateral on the dorsum of his foot and the pain full area is involving the third and fourth metatarsal shafts.  There is no ankle involvement.        Past Medical History:  Diagnosis Date   Hypertension    Seasonal allergies     There are no problems to display for this patient.   Past Surgical History:  Procedure Laterality Date   WISDOM TOOTH EXTRACTION         Home Medications    Prior to Admission medications   Medication Sig Start Date End Date Taking? Authorizing Provider  amLODipine (NORVASC) 10 MG tablet Take 10 mg by mouth daily.   Yes [provider]  ibuprofen (ADVIL) 400 MG tablet 1-2 q 6h prn foot pain. If BP > 140/90 while on this, then discontinue it. 05/24/19  Yes Rodriguez-Southworth, Nettie Elm, PA-C  losartan (COZAAR) 50 MG tablet Take 50 mg by mouth daily.   Yes [provider]  valACYclovir (VALTREX) 1000 MG tablet Take 1 tablet (1,000 mg total) by mouth 3 (three) times daily. 08/09/17  Yes Cook, Jayce G, DO  meloxicam (MOBIC) 15 MG tablet Take 1 tablet (15 mg total) by mouth daily. 06/30/19   Lutricia Feil, PA-C  cetirizine (ZYRTEC  ALLERGY) 10 MG tablet Take 1 tablet (10 mg total) by mouth daily. 01/28/17 05/04/19  Lutricia Feil, PA-C    Family History Family History  Problem Relation Age of Onset   Healthy Mother    Hypertension Father    Deep vein thrombosis Father     Social History Social History   Tobacco Use   Smoking status: Never Smoker   Smokeless tobacco: Never Used  Substance Use Topics   Alcohol use: Yes    Comment: occasional   Drug use: No     Allergies   Patient has no known allergies.   Review of Systems Review of Systems  Constitutional: Positive for activity change. Negative for appetite change, chills, diaphoresis, fatigue and fever.  Musculoskeletal: Positive for gait problem and myalgias.  All other systems reviewed and are negative.    Physical Exam Triage Vital Signs ED Triage Vitals  Enc Vitals Group     BP 06/30/19 1228 (!) 150/110     Pulse Rate 06/30/19 1228 90     Resp 06/30/19 1228 18     Temp 06/30/19 1228 98.2 F (36.8 C)     Temp Source 06/30/19 1228 Oral     SpO2 06/30/19 1228 98 %     Weight  06/30/19 1225 (!) 370 lb (167.8 kg)     Height 06/30/19 1225 6\' 2"  (1.88 m)     Head Circumference --      Peak Flow --      Pain Score 06/30/19 1225 8     Pain Loc --      Pain Edu? --      Excl. in [place]? --    No data found.  Updated Vital Signs BP (!) 150/110 (BP Location: Left Arm)    Pulse 90    Temp 98.2 F (36.8 C) (Oral)    Resp 18    Ht 6\' 2"  (1.88 m)    Wt (!) 370 lb (167.8 kg)    SpO2 98%    BMI 47.51 kg/m   Visual Acuity Right Eye Distance:   Left Eye Distance:   Bilateral Distance:    Right Eye Near:   Left Eye Near:    Bilateral Near:     Physical Exam Vitals and nursing note reviewed.  Constitutional:      General: He is not in acute distress.    Appearance: Normal appearance. He is obese. He is not ill-appearing, toxic-appearing or diaphoretic.  HENT:     Head: Normocephalic and atraumatic.  Eyes:     Conjunctiva/sclera:  Conjunctivae normal.  Musculoskeletal:        General: Swelling and tenderness present.     Cervical back: Normal range of motion and neck supple.     Comments: Examination of the right foot shows swelling over the dorsum of the foot extending from midfoot to the MP joints.  There is most marked from the third metatarsal laterally.  It is concentrated in the third and fourth metatarsals.  There is bogginess but no pitting edema present.  Pulses are palpable.  He has good capillary refill.  Maximum tenderness is at the midshaft area of metatarsals 3 and 4.  There is no tenderness along the fifth metatarsal and none along the second metatarsal or first metatarsal.  Injuries are uninvolved.  Ankle is not involved.  Skin:    General: Skin is warm and dry.  Neurological:     General: No focal deficit present.     Mental Status: He is alert and oriented to person, place, and time.  Psychiatric:        Mood and Affect: Mood normal.        Behavior: Behavior normal.        Thought Content: Thought content normal.        Judgment: Judgment normal.      UC Treatments / Results  Labs (all labs ordered are listed, but only abnormal results are displayed) Labs Reviewed - No data to display  EKG   Radiology DG Foot Complete Right  Result Date: 06/30/2019 CLINICAL DATA:  Foot pain and swelling. EXAM: RIGHT FOOT COMPLETE - 3+ VIEW COMPARISON:  None. FINDINGS: There is moderate to marked progressive dorsal soft tissue swelling. No underlying fracture or dislocation identified. No radiopaque foreign bodies. IMPRESSION: Progressive dorsal soft tissue swelling.  No acute bone abnormality. Electronically Signed   By: Kerby Moors M.D.   On: 06/30/2019 13:42    Procedures Procedures (including critical care time)  Medications Ordered in UC Medications - No data to display  Initial Impression / Assessment and Plan / UC Course  I have reviewed the triage vital signs and the nursing  notes.  Pertinent labs & imaging results that were available during my care of  the patient were reviewed by me and considered in my medical decision making (see chart for details).   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  I reviewed his old records available to me in detail.  I reviewed the x-ray from his previous visit compared with this visit.  She has increased swelling of the forefoot as described in my physical exam.  I have told him that he needs to elevate his foot sufficiently to control the swelling and discomfort.  He will continue to use his postop shoe to prevent further injury to the foot.  Should avoid symptoms or activities which seem to exacerbate his symptoms.  I will place him on Mobic for increased anti-inflammatory effect.  I told him that he will need to be seen by a podiatrist for further evaluation including perhaps an MRI.  He has no other systemic symptoms at this time raising red flag concerns.  It is possible that he had sustained a sprain getting in and out of his semitruck.  He did have one time when he did prolonged standing and walking [place].  Otherwise he does not have significant injury history sufficient to produce the signs we are seeing today.  He will arrange the appointment with the podiatrist.   Final Clinical Impressions(s) / UC Diagnoses   Final diagnoses:  Foot pain, right     Discharge Instructions     Apply ice 20 minutes out of every 2 hours 4-5 times daily for comfort.  Elevate as necessary to control swelling and pain.  Follow up with podiatry for further evaluation    ED Prescriptions    Medication Sig Dispense Auth. Provider   meloxicam (MOBIC) 15 MG tablet Take 1 tablet (15 mg total) by mouth daily. 30 tablet Lutricia Feil, PA-C     PDMP not reviewed this encounter.   Lutricia Feil, PA-C 06/30/19 [phone]

## 2019-06-30 NOTE — ED Triage Notes (Signed)
Pt c/o right foot pain and swelling. He states that he was seen here at [facility] for this about a month ago. He has continued pain and swelling. He is wearing his post op shoe that he was given at his visit and he has been taking ibuprofen.

## 2019-09-03 ENCOUNTER — Ambulatory Visit: Payer: BC Managed Care – PPO | Attending: Internal Medicine

## 2019-09-03 ENCOUNTER — Other Ambulatory Visit: Payer: Self-pay

## 2019-09-03 DIAGNOSIS — Z23 Encounter for immunization: Secondary | ICD-10-CM

## 2019-09-03 NOTE — Progress Notes (Signed)
   Covid-19 Vaccination Clinic  Name:  Carlos Oneal    MRN: [id number] DOB: 10-10-1990  09/03/2019  Carlos Oneal was observed post Covid-19 immunization for 15 minutes without incident. He was provided with Vaccine Information Sheet and instruction to access the V-Safe system.   Carlos Oneal was instructed to call 911 with any severe reactions post vaccine: Marland Kitchen Difficulty breathing  . Swelling of face and throat  . A fast heartbeat  . A bad rash all over body  . Dizziness and weakness   Immunizations Administered    Name Date Dose VIS Date Route   Pfizer COVID-19 Vaccine 09/03/2019 10:35 AM 0.3 mL 05/09/2019 Intramuscular   Manufacturer: [employer], [employer]   Lot: [id number]   NDC: [address]-8

## 2019-09-04 ENCOUNTER — Ambulatory Visit: Payer: BC Managed Care – PPO

## 2019-09-24 ENCOUNTER — Ambulatory Visit: Payer: BC Managed Care – PPO | Attending: Internal Medicine

## 2019-09-24 DIAGNOSIS — Z23 Encounter for immunization: Secondary | ICD-10-CM

## 2019-09-24 NOTE — Progress Notes (Signed)
   Covid-19 Vaccination Clinic  Name:  Carlos Oneal    MRN: [id number] DOB: 1990/08/15  09/24/2019  [name] was observed post Covid-19 immunization for 15 minutes without incident. He was provided with Vaccine Information Sheet and instruction to access the V-Safe system.   [name] was instructed to call 911 with any severe reactions post vaccine: Marland Kitchen Difficulty breathing  . Swelling of face and throat  . A fast heartbeat  . A bad rash all over body  . Dizziness and weakness   Immunizations Administered    Name Date Dose VIS Date Route   Pfizer COVID-19 Vaccine 09/24/2019 10:39 AM 0.3 mL 07/23/2018 Intramuscular   Manufacturer: [employer], [employer]   Lot: [id number]   NDC: [address]-9

## 2020-02-28 ENCOUNTER — Other Ambulatory Visit: Payer: Self-pay

## 2020-02-28 ENCOUNTER — Ambulatory Visit
Admission: EM | Admit: 2020-02-28 | Discharge: 2020-02-28 | Disposition: A | Discharge status: Home / Self Care | Payer: BC Managed Care – PPO

## 2020-02-28 DIAGNOSIS — R2242 Localized swelling, mass and lump, left lower limb: Secondary | ICD-10-CM | POA: Diagnosis not present

## 2020-02-28 DIAGNOSIS — M545 Low back pain, unspecified: Secondary | ICD-10-CM | POA: Diagnosis not present

## 2020-02-28 DIAGNOSIS — S161XXA Strain of muscle, fascia and tendon at neck level, initial encounter: Secondary | ICD-10-CM | POA: Diagnosis not present

## 2020-02-28 HISTORY — DX: Gout, unspecified: M10.9

## 2020-02-28 MED ORDER — TIZANIDINE HCL 4 MG PO TABS: 4.0000 mg | ORAL_TABLET | Freq: Four times a day (QID) | ORAL | 0 refills | Status: AC | PRN

## 2020-02-28 MED ORDER — DICLOFENAC SODIUM 75 MG PO TBEC: 75.0000 mg | DELAYED_RELEASE_TABLET | Freq: Two times a day (BID) | ORAL | 0 refills | Status: AC
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
## 2020-02-28 NOTE — Discharge Instructions (Addendum)
NECK PAIN: Stressed avoiding painful activities. This can exacerbate your symptoms and make them worse.  May apply heat to the areas of pain for some relief. Use medications as directed. Be aware of which medications make you drowsy and do not drive or operate any kind of heavy machinery while using the medication (ie pain medications or muscle relaxers). F/U with PCP for reexamination or return sooner if condition worsens or does not begin to improve over the next few days.  ? ?NECK PAIN RED FLAGS: If symptoms get worse than they are right now, you should come back sooner for re-evaluation. If you have increased numbness/ tingling or notice that the numbness/tingling is affecting the legs or saddle region, go to ER. If you ever lose continence go to ER.     ? ?BACK PAIN: Stressed avoiding painful activities . RICE (REST, ICE, COMPRESSION, ELEVATION) guidelines reviewed. May alternate ice and heat. Consider use of muscle rubs, Salonpas patches, etc. Use medications as directed including muscle relaxers if prescribed. Take anti-inflammatory medications as prescribed or OTC NSAIDs/Tylenol.  F/u with PCP in 7-10 days for reexamination, and please feel free to call or return to the urgent care at any time for any questions or concerns you may have and we will be happy to help you!  ? ?BACK PAIN RED FLAGS: If the back pain acutely worsens or there are any red flag symptoms such as numbness/tingling, leg weakness, saddle anesthesia, or loss of bowel/bladder control, go immediately to the ER. Follow up with us as scheduled or sooner if the pain does not begin to resolve or if it worsens before the follow up   ?

## 2020-03-26 ENCOUNTER — Other Ambulatory Visit: Payer: Self-pay

## 2020-03-26 ENCOUNTER — Encounter: Payer: Self-pay | Admitting: Emergency Medicine

## 2020-03-26 ENCOUNTER — Ambulatory Visit
Admission: EM | Admit: 2020-03-26 | Discharge: 2020-03-26 | Disposition: A | Discharge status: Home / Self Care | Payer: 59 | Attending: Physician Assistant | Admitting: Physician Assistant

## 2020-03-26 DIAGNOSIS — M109 Gout, unspecified: Secondary | ICD-10-CM

## 2020-03-26 DIAGNOSIS — M79671 Pain in right foot: Secondary | ICD-10-CM

## 2020-03-26 MED ORDER — PREDNISONE 20 MG PO TABS: 40.0000 mg | ORAL_TABLET | Freq: Every day | ORAL | 0 refills | Status: AC

## 2020-03-26 NOTE — ED Provider Notes (Signed)
[facility]    CSN: [id number] Arrival date & time: 03/26/20  1714      History   Chief Complaint Chief Complaint  Patient presents with  . Foot Pain    right    HPI Carlos Oneal is a 29 y.o. male   presenting for right dorsal foot pain for the past 6 days. He denies any injury. He states that he does have a history of gout and has had flareups in the exact area where he is having redness, swelling and pain. He has taken Tylenol and NSAIDs without relief. He states that he does have a podiatrist but could not get an appointment. Patient states that he has taken prednisone in the past and that has provided him with the most relief. He denies any numbness or tingling. He does state that is very painful to walk on the foot and has been using a postop shoe since the foot is still swollen. He has no other complaints or concerns today.  HPI  Past Medical History:  Diagnosis Date  . Gout   . Hypertension   . Seasonal allergies     There are no problems to display for this patient.   Past Surgical History:  Procedure Laterality Date  . WISDOM TOOTH EXTRACTION         Home Medications    Prior to Admission medications   Medication Sig Start Date End Date Taking? Authorizing Provider  amLODipine (NORVASC) 10 MG tablet Take 10 mg by mouth daily.   Yes [provider]  losartan (COZAAR) 50 MG tablet Take 50 mg by mouth daily.   Yes [provider]  predniSONE (DELTASONE) 20 MG tablet Take 2 tablets (40 mg total) by mouth daily for 5 days. 03/26/20 03/31/20  Shirlee Latch, PA-C  valACYclovir (VALTREX) 1000 MG tablet Take 1 tablet (1,000 mg total) by mouth 3 (three) times daily. 08/09/17   Tommie Sams, DO  cetirizine (ZYRTEC ALLERGY) 10 MG tablet Take 1 tablet (10 mg total) by mouth daily. 01/28/17 05/04/19  Lutricia Feil, PA-C  colchicine 0.6 MG tablet Take 1 tablet by mouth as directed. 07/16/19 03/26/20  [provider]     Family History Family History  Problem Relation Age of Onset  . Healthy Mother   . Hypertension Father   . Deep vein thrombosis Father     Social History Social History   Tobacco Use  . Smoking status: Never Smoker  . Smokeless tobacco: Never Used  Vaping Use  . Vaping Use: Never used  Substance Use Topics  . Alcohol use: Yes    Comment: occasional  . Drug use: No     Allergies   Patient has no known allergies.   Review of Systems Review of Systems  Constitutional: Negative for fatigue and fever.  Musculoskeletal: Positive for arthralgias, gait problem and joint swelling.  Skin: Positive for color change. Negative for rash and wound.  Neurological: Negative for weakness and numbness.     Physical Exam Triage Vital Signs ED Triage Vitals  Enc Vitals Group     BP 03/26/20 1814 (!) 157/118     Pulse Rate 03/26/20 1814 (!) 103     Resp 03/26/20 1814 16     Temp 03/26/20 1814 99.5 F (37.5 C)     Temp Source 03/26/20 1814 Oral     SpO2 03/26/20 1814 97 %     Weight 03/26/20 1811 (!) 350 lb (158.8 kg)  Height 03/26/20 1811 6\' 2"  (1.88 m)     Head Circumference --      Peak Flow --      Pain Score 03/26/20 1811 8     Pain Loc --      Pain Edu? --      Excl. in GC? --    No data found.  Updated Vital Signs BP (!) 157/118 (BP Location: Left Arm)   Pulse (!) 103   Temp 99.5 F (37.5 C) (Oral)   Resp 16   Ht 6\' 2"  (1.88 m)   Wt (!) 350 lb (158.8 kg)   SpO2 97%   BMI 44.94 kg/m       Physical Exam Vitals and nursing note reviewed.  Constitutional:      General: He is not in acute distress.    Appearance: Normal appearance. He is well-developed. He is obese. He is not ill-appearing or toxic-appearing.  HENT:     Head: Normocephalic and atraumatic.  Eyes:     General: No scleral icterus.    Conjunctiva/sclera: Conjunctivae normal.  Cardiovascular:     Rate and Rhythm: Normal rate and regular rhythm.     Pulses: Normal pulses.     Heart  sounds: Normal heart sounds.  Pulmonary:     Effort: Pulmonary effort is normal. No respiratory distress.     Breath sounds: Normal breath sounds.  Musculoskeletal:     Cervical back: Neck supple.     Left foot: Normal range of motion. Swelling (moderate swelling, erythema and warmth dorsal forefoot at base of second-fouth digits) and tenderness (diffuse TTP in areas of swelling as noted) present. Normal pulse.  Skin:    General: Skin is warm and dry.  Neurological:     General: No focal deficit present.     Mental Status: He is alert. Mental status is at baseline.     Motor: No weakness.     Gait: Gait abnormal.  Psychiatric:        Mood and Affect: Mood normal.        Behavior: Behavior normal.        Thought Content: Thought content normal.      UC Treatments / Results  Labs (all labs ordered are listed, but only abnormal results are displayed) Labs Reviewed - No data to display  EKG   Radiology No results found.  Procedures Procedures (including critical care time)  Medications Ordered in UC Medications - No data to display  Initial Impression / Assessment and Plan / UC Course  I have reviewed the triage vital signs and the nursing notes.  Pertinent labs & imaging results that were available during my care of the patient were reviewed by me and considered in my medical decision making (see chart for details).   29 year old male with history of gout presenting for suspected gout flareup of the right foot.  He states that he has swelling and redness in the exact area that he has had a flareup in the past.  On exam he does have diffuse moderate swelling at the bases of the second through fourth digits and the areas are diffusely tender.  There are no signs of abrasions or ulcerations.  Since he has already tried Tylenol and NSAIDs without relief and is likely too late to begin colchicine, treating with prednisone at this time.  Advised him to keep an eye on his blood  pressures make sure he is taking his blood pressure medications as prednisone can raise  the blood pressure.  Advised ice and elevate the foot.  Advised to follow-up with podiatrist.  Can return to our clinic as needed for any new or worsening symptoms.  Final Clinical Impressions(s) / UC Diagnoses   Final diagnoses:  Acute gout of right foot, unspecified cause  Foot pain, right   Discharge Instructions   None    ED Prescriptions    Medication Sig Dispense Auth. Provider   predniSONE (DELTASONE) 20 MG tablet Take 2 tablets (40 mg total) by mouth daily for 5 days. 10 tablet Gareth Morgan     PDMP not reviewed this encounter.   Shirlee Latch, PA-C 03/27/20 [phone]

## 2020-03-26 NOTE — ED Triage Notes (Signed)
Patient c/o pain on the top of his right foot that started last Saturday.  Patient denies injury or fall.  Patient reports history of gout.

## 2020-04-11 ENCOUNTER — Other Ambulatory Visit: Payer: Self-pay

## 2020-04-11 ENCOUNTER — Emergency Department
Admission: EM | Admit: 2020-04-11 | Discharge: 2020-04-11 | Disposition: A | Discharge status: Home / Self Care | Payer: 59 | Attending: Emergency Medicine | Admitting: Emergency Medicine

## 2020-04-11 DIAGNOSIS — M79672 Pain in left foot: Secondary | ICD-10-CM

## 2020-04-11 DIAGNOSIS — I10 Essential (primary) hypertension: Secondary | ICD-10-CM | POA: Diagnosis not present

## 2020-04-11 DIAGNOSIS — Z79899 Other long term (current) drug therapy: Secondary | ICD-10-CM | POA: Diagnosis not present

## 2020-04-11 MED ORDER — KETOROLAC TROMETHAMINE 30 MG/ML IJ SOLN: 30.0000 mg | Freq: Once | INTRAMUSCULAR | Status: DC

## 2020-04-11 MED ORDER — INDOMETHACIN 50 MG PO CAPS: 50.0000 mg | ORAL_CAPSULE | Freq: Two times a day (BID) | ORAL | 0 refills | Status: AC

## 2020-04-11 MED ORDER — KETOROLAC TROMETHAMINE 30 MG/ML IJ SOLN
30.0000 mg | Freq: Once | INTRAMUSCULAR | Status: AC
  Administered 2020-04-11: 30 mg via INTRAMUSCULAR

## 2020-04-11 NOTE — Discharge Instructions (Addendum)
Take Indomethacin twice daily for the next three days.

## 2020-04-11 NOTE — ED Provider Notes (Signed)
Emergency Department Provider Note  ____________________________________________  Time seen: Approximately 4:30 PM  I have reviewed the triage vital signs and the nursing notes.   HISTORY  Chief Complaint Foot Pain   Historian Patient   HPI Carlos Oneal is a 29 y.o. male presents to the emergency department with acute left foot pain along the dorsal aspect of the foot. Patient has a history of gout and hypertension and he reports that his current pain feels similar to gout flares in the past. Patient states that he just recently finished a course of prednisone and was started on allopurinol and colchicine by his primary care provider. Patient states that he is only had 1 dose of colchicine and 1 dose of allopurinol. He denies fever and chills. No pain in the left calf. He denies pleuritic chest pain or shortness of breath. He has not noticed any erythema along the dorsal aspect of the left foot. No other alleviating measures have been attempted.   Past Medical History:  Diagnosis Date  . Gout   . Hypertension   . Seasonal allergies      Immunizations up to date:  Yes.     Past Medical History:  Diagnosis Date  . Gout   . Hypertension   . Seasonal allergies     There are no problems to display for this patient.   Past Surgical History:  Procedure Laterality Date  . WISDOM TOOTH EXTRACTION      Prior to Admission medications   Medication Sig Start Date End Date Taking? Authorizing Provider  amLODipine (NORVASC) 10 MG tablet Take 10 mg by mouth daily.    [provider]  indomethacin (INDOCIN) 50 MG capsule Take 1 capsule (50 mg total) by mouth 2 (two) times daily with a meal for 3 days. 04/11/20 04/14/20  Orvil Feil, PA-C  losartan (COZAAR) 50 MG tablet Take 50 mg by mouth daily.    [provider]  valACYclovir (VALTREX) 1000 MG tablet Take 1 tablet (1,000 mg total) by mouth 3 (three) times daily. 08/09/17   Tommie Sams, DO   cetirizine (ZYRTEC ALLERGY) 10 MG tablet Take 1 tablet (10 mg total) by mouth daily. 01/28/17 05/04/19  Lutricia Feil, PA-C  colchicine 0.6 MG tablet Take 1 tablet by mouth as directed. 07/16/19 03/26/20  [provider]    Allergies Patient has no known allergies.  Family History  Problem Relation Age of Onset  . Healthy Mother   . Hypertension Father   . Deep vein thrombosis Father     Social History Social History   Tobacco Use  . Smoking status: Never Smoker  . Smokeless tobacco: Never Used  Vaping Use  . Vaping Use: Never used  Substance Use Topics  . Alcohol use: Yes    Comment: occasional  . Drug use: No     Review of Systems  Constitutional: No fever/chills Eyes:  No discharge ENT: No upper respiratory complaints. Respiratory: no cough. No SOB/ use of accessory muscles to breath Gastrointestinal:   No nausea, no vomiting.  No diarrhea.  No constipation. Musculoskeletal: Patient has left foot pain.  Skin: Negative for rash, abrasions, lacerations, ecchymosis.    ____________________________________________   PHYSICAL EXAM:  VITAL SIGNS: ED Triage Vitals  Enc Vitals Group     BP 04/11/20 1551 (!) 97/53     Pulse Rate 04/11/20 1551 (!) 102     Resp 04/11/20 1551 14     Temp 04/11/20 1551 99 F (37.2 C)  Temp src --      SpO2 04/11/20 1551 99 %     Weight 04/11/20 1548 (!) 380 lb (172.4 kg)     Height 04/11/20 1548 6\' 2"  (1.88 m)     Head Circumference --      Peak Flow --      Pain Score 04/11/20 1548 9     Pain Loc --      Pain Edu? --      Excl. in GC? --      Constitutional: Alert and oriented. Well appearing and in no acute distress. Eyes: Conjunctivae are normal. PERRL. EOMI. Head: Atraumatic. Cardiovascular: Normal rate, regular rhythm. Normal S1 and S2.  Good peripheral circulation. Respiratory: Normal respiratory effort without tachypnea or retractions. Lungs CTAB. Good air entry to the bases with no decreased or absent  breath sounds Gastrointestinal: Bowel sounds x 4 quadrants. Soft and nontender to palpation. No guarding or rigidity. No distention. Musculoskeletal: Feet appear symmetrical in size. No overlying erythema. No pitting edema. No calf pain to palpation. Palpable dorsalis pedis pulse bilaterally and symmetrically. Capillary refill less than 2 seconds. Neurologic:  Normal for age. No gross focal neurologic deficits are appreciated.  Skin:  Skin is warm, dry and intact. No rash noted. Psychiatric: Mood and affect are normal for age. Speech and behavior are normal.   ____________________________________________   LABS (all labs ordered are listed, but only abnormal results are displayed)  Labs Reviewed - No data to display ____________________________________________  EKG   ____________________________________________  RADIOLOGY   No results found.  ____________________________________________    PROCEDURES  Procedure(s) performed:     Procedures     Medications  ketorolac (TORADOL) 30 MG/ML injection 30 mg (has no administration in time range)     ____________________________________________   INITIAL IMPRESSION / ASSESSMENT AND PLAN / ED COURSE  Pertinent labs & imaging results that were available during my care of the patient were reviewed by me and considered in my medical decision making (see chart for details).      Assessment and plan Foot pain 29 year old male presents to the emergency department with acute left foot pain that has persisted despite prednisone use. Patient reports that his foot pain feels similar to gout flares in the past. On exam, foot appears nonedematous and nonerythematous. Patient is neurovascularly intact.  Patient states that he has tolerated NSAIDs well in the past with no history of GI bleeds. Will give Toradol in the emergency department and start patient on indomethacin twice daily for the next 3 days. Cautioned patient to start  indomethacin tomorrow. Recommended continuing colchicine and allopurinol as directed by PCP. Return precautions were given to return with new or worsening symptoms.    ____________________________________________  FINAL CLINICAL IMPRESSION(S) / ED DIAGNOSES  Final diagnoses:  Left foot pain      NEW MEDICATIONS STARTED DURING THIS VISIT:  ED Discharge Orders         Ordered    indomethacin (INDOCIN) 50 MG capsule  2 times daily with meals        04/11/20 1637              This chart was dictated using voice recognition software/Dragon. Despite best efforts to proofread, errors can occur which can change the meaning. Any change was purely unintentional.     04/13/20, PA-C 04/11/20 1639    04/13/20, MD 04/11/20 [phone]

## 2020-04-11 NOTE — ED Triage Notes (Signed)
Pt presents via POV c/o left foot swelling and pain. Denies injury. Reports treated for acute gout flare as OP with Prednisone, Allopurinol, and Colchesine without improvement. Denies injury.

## 2020-04-11 NOTE — ED Notes (Signed)
See triage note- pt here with diagnosed gout. Pt reports periods of on and off pain.

## 2020-04-11 NOTE — ED Notes (Signed)
No reaction to injection site noted

## 2020-10-18 ENCOUNTER — Ambulatory Visit
Admission: EM | Admit: 2020-10-18 | Discharge: 2020-10-18 | Disposition: A | Discharge status: Home / Self Care | Payer: No Typology Code available for payment source | Attending: Internal Medicine | Admitting: Internal Medicine

## 2020-10-18 ENCOUNTER — Other Ambulatory Visit: Payer: Self-pay

## 2020-10-18 DIAGNOSIS — R2242 Localized swelling, mass and lump, left lower limb: Secondary | ICD-10-CM | POA: Diagnosis present

## 2020-10-18 DIAGNOSIS — L03115 Cellulitis of right lower limb: Secondary | ICD-10-CM | POA: Insufficient documentation

## 2020-10-18 DIAGNOSIS — R2241 Localized swelling, mass and lump, right lower limb: Secondary | ICD-10-CM

## 2020-10-18 DIAGNOSIS — L03116 Cellulitis of left lower limb: Secondary | ICD-10-CM | POA: Diagnosis not present

## 2020-10-18 DIAGNOSIS — B353 Tinea pedis: Secondary | ICD-10-CM | POA: Insufficient documentation

## 2020-10-18 LAB — CBC WITH DIFFERENTIAL/PLATELET
Abs Immature Granulocytes: 0.05 10*3/uL (ref 0.00–0.07)
Basophils Absolute: 0 10*3/uL (ref 0.0–0.1)
Basophils Relative: 0 %
Eosinophils Absolute: 1.5 10*3/uL — ABNORMAL HIGH (ref 0.0–0.5)
Eosinophils Relative: 14 %
HCT: 45.1 % (ref 39.0–52.0)
Hemoglobin: 14.2 g/dL (ref 13.0–17.0)
Immature Granulocytes: 1 %
Lymphocytes Relative: 33 %
Lymphs Abs: 3.4 10*3/uL (ref 0.7–4.0)
MCH: 27.3 pg (ref 26.0–34.0)
MCHC: 31.5 g/dL (ref 30.0–36.0)
MCV: 86.6 fL (ref 80.0–100.0)
Monocytes Absolute: 0.5 10*3/uL (ref 0.1–1.0)
Monocytes Relative: 5 %
Neutro Abs: 4.8 10*3/uL (ref 1.7–7.7)
Neutrophils Relative %: 47 %
Platelets: 373 10*3/uL (ref 150–400)
RBC: 5.21 MIL/uL (ref 4.22–5.81)
RDW: 13.4 % (ref 11.5–15.5)
WBC: 10.2 10*3/uL (ref 4.0–10.5)
nRBC: 0 % (ref 0.0–0.2)

## 2020-10-18 LAB — URIC ACID: Uric Acid, Serum: 11.5 mg/dL — ABNORMAL HIGH (ref 3.7–8.6)

## 2020-10-18 MED ORDER — DOXYCYCLINE HYCLATE 100 MG PO CAPS: 100.0000 mg | ORAL_CAPSULE | Freq: Two times a day (BID) | ORAL | 0 refills | Status: DC

## 2020-10-18 MED ORDER — KETOCONAZOLE 2 % EX CREA: 1.0000 "application " | TOPICAL_CREAM | Freq: Two times a day (BID) | CUTANEOUS | 0 refills | Status: AC

## 2020-10-18 MED ORDER — METHYLPREDNISOLONE 4 MG PO TBPK: ORAL_TABLET | ORAL | 0 refills | Status: DC

## 2020-10-18 NOTE — ED Triage Notes (Signed)
Pt c/o possible gout flare in both feet since last week. Pt has contacted his podiatrist but he is out of the office. Pt does take Allopurinol daily, has not had a breakthrough in about 7 months.

## 2020-10-18 NOTE — ED Provider Notes (Signed)
[facility]    CSN: [id number] Arrival date & time: 10/18/20  1124      History   Chief Complaint Chief Complaint  Patient presents with  . Gout    HPI Carlos Oneal is a 30 y.o. male who presents with R foot pain and redness x 1 week ago, then the L deveoped redness and pain x 2 days. He stays of a gout diet and take Allopurinal qd.  Had a bug bite on R shin which is healing. Denies njuring himself.     Past Medical History:  Diagnosis Date  . Gout   . Hypertension   . Seasonal allergies     There are no problems to display for this patient.   Past Surgical History:  Procedure Laterality Date  . WISDOM TOOTH EXTRACTION         Home Medications    Prior to Admission medications   Medication Sig Start Date End Date Taking? Authorizing Provider  allopurinol (ZYLOPRIM) 100 MG tablet Take 100 mg by mouth daily. 05/16/20  Yes [provider]  amLODipine (NORVASC) 10 MG tablet Take 10 mg by mouth daily.   Yes [provider]  doxycycline (VIBRAMYCIN) 100 MG capsule Take 1 capsule (100 mg total) by mouth 2 (two) times daily. 10/18/20  Yes Rodriguez-Southworth, Nettie Elm, PA-C  ketoconazole (NIZORAL) 2 % cream Apply 1 application topically 2 (two) times daily for 14 days. Then prn recurrence 10/18/20 11/01/20 Yes Rodriguez-Southworth, Nettie Elm, PA-C  losartan (COZAAR) 50 MG tablet Take 50 mg by mouth daily.   Yes [provider]  methylPREDNISolone (MEDROL DOSEPAK) 4 MG TBPK tablet Take as directed 10/18/20  Yes Rodriguez-Southworth, Nettie Elm, PA-C  cetirizine (ZYRTEC ALLERGY) 10 MG tablet Take 1 tablet (10 mg total) by mouth daily. 01/28/17 05/04/19  Lutricia Feil, PA-C  colchicine 0.6 MG tablet Take 1 tablet by mouth as directed. 07/16/19 03/26/20  [provider]    Family History Family History  Problem Relation Age of Onset  . Healthy Mother   . Hypertension Father   . Deep vein thrombosis Father     Social  History Social History   Tobacco Use  . Smoking status: Never Smoker  . Smokeless tobacco: Never Used  Vaping Use  . Vaping Use: Never used  Substance Use Topics  . Alcohol use: Yes    Comment: occasional  . Drug use: No     Allergies   Patient has no known allergies.   Review of Systems Review of Systems  Constitutional: Negative for fever.  Musculoskeletal: Positive for arthralgias and gait problem.  Skin: Positive for color change.     Physical Exam Triage Vital Signs ED Triage Vitals  Enc Vitals Group     BP 10/18/20 1203 (!) 143/102     Pulse Rate 10/18/20 1203 92     Resp 10/18/20 1203 18     Temp 10/18/20 1203 98.4 F (36.9 C)     Temp Source 10/18/20 1203 Oral     SpO2 10/18/20 1203 99 %     Weight 10/18/20 1202 (!) 400 lb (181.4 kg)     Height 10/18/20 1202 6\' 2"  (1.88 m)     Head Circumference --      Peak Flow --      Pain Score 10/18/20 1201 9     Pain Loc --      Pain Edu? --      Excl. in GC? --    No data found.  Updated Vital Signs BP (!) 143/102 (BP Location: Left Arm)   Pulse 92   Temp 98.4 F (36.9 C) (Oral)   Resp 18   Ht 6\' 2"  (1.88 m)   Wt (!) 400 lb (181.4 kg)   SpO2 99%   BMI 51.36 kg/m   Visual Acuity Right Eye Distance:   Left Eye Distance:   Bilateral Distance:    Right Eye Near:   Left Eye Near:    Bilateral Near:     Physical Exam Vitals and nursing note reviewed.  Constitutional:      General: He is not in acute distress.    Appearance: He is obese. He is not toxic-appearing.  HENT:     Head: Normocephalic.     Right Ear: External ear normal.     Left Ear: External ear normal.  Eyes:     General: No scleral icterus.    Conjunctiva/sclera: Conjunctivae normal.  Pulmonary:     Effort: Pulmonary effort is normal.  Musculoskeletal:        General: Normal range of motion.     Cervical back: Neck supple.  Skin:    General: Skin is warm and dry.     Findings: No rash.     Comments: Has erythema and warmth  on dorsal feet bilaterally R>L, has healing scab on R anterior ankle, but there is no redness around this. Has maceration and open skin between his toes. His R medial ankle is swollen and tender.  Neurological:     Mental Status: He is alert and oriented to person, place, and time.     Gait: Gait normal.  Psychiatric:        Mood and Affect: Mood normal.        Behavior: Behavior normal.        Thought Content: Thought content normal.        Judgment: Judgment normal.      UC Treatments / Results  Labs (all labs ordered are listed, but only abnormal results are displayed) Labs Reviewed  CBC WITH DIFFERENTIAL/PLATELET - Abnormal; Notable for the following components:      Result Value   Eosinophils Absolute 1.5 (*)    All other components within normal limits  URIC ACID - Abnormal; Notable for the following components:   Uric Acid, Serum 11.5 (*)    All other components within normal limits    EKG   Radiology No results found.  Procedures Procedures (including critical care time)  Medications Ordered in UC Medications - No data to display  Initial Impression / Assessment and Plan / UC Course  I have reviewed the triage vital signs and the nursing notes. Pertinent labs  results that were available during my care of the patient were reviewed by me and considered in my medical decision making (see chart for details). Has gout of R ankle, and cellulitis of both feet secondary to Tenia pedis. I placed him on medrol, doxy and nizoral cream as noted. Educated how to keep areas between his toes dry and prevent further fungal infection.    Final Clinical Impressions(s) / UC Diagnoses   Final diagnoses:  Cellulitis of left lower extremity  Cellulitis of right lower extremity  Tinea pedis of both feet  Localized swelling of right foot  Localized swelling of left foot     Discharge Instructions     I will call you when the uric acid level is back and if high I will send  the prednisone  pack   Work on drying between your toes well and keep it aerated     ED Prescriptions    Medication Sig Dispense Auth. Provider   ketoconazole (NIZORAL) 2 % cream Apply 1 application topically 2 (two) times daily for 14 days. Then prn recurrence 60 g Rodriguez-Southworth, Nettie Elm, PA-C   doxycycline (VIBRAMYCIN) 100 MG capsule Take 1 capsule (100 mg total) by mouth 2 (two) times daily. 20 capsule Rodriguez-Southworth, , PA-C   methylPREDNISolone (MEDROL DOSEPAK) 4 MG TBPK tablet Take as directed 21 tablet Rodriguez-Southworth, Nettie Elm, PA-C     PDMP not reviewed this encounter.   Garey Ham, [place] 10/18/20 1343

## 2020-10-18 NOTE — Discharge Instructions (Signed)
I will call you when the uric acid level is back and if high I will send the prednisone pack   Work on drying between your toes well and keep it aerated

## 2021-01-03 ENCOUNTER — Encounter: Payer: Self-pay | Admitting: Emergency Medicine

## 2021-01-03 ENCOUNTER — Ambulatory Visit (INDEPENDENT_AMBULATORY_CARE_PROVIDER_SITE_OTHER): Payer: No Typology Code available for payment source

## 2021-01-03 ENCOUNTER — Other Ambulatory Visit: Payer: Self-pay

## 2021-01-03 ENCOUNTER — Ambulatory Visit
Admission: EM | Admit: 2021-01-03 | Discharge: 2021-01-03 | Disposition: A | Discharge status: Home / Self Care | Payer: No Typology Code available for payment source | Attending: Family Medicine | Admitting: Family Medicine

## 2021-01-03 DIAGNOSIS — M25562 Pain in left knee: Secondary | ICD-10-CM

## 2021-01-03 MED ORDER — PREDNISONE 10 MG PO TABS: ORAL_TABLET | ORAL | 0 refills | Status: DC

## 2021-01-03 NOTE — Discharge Instructions (Addendum)
Xray was normal.  Rest, ice, elevation.  Medication as prescribed.  If persists, call [facility] clinic Orthopedics [phone]) OR [facility] [phone]) for an appt.

## 2021-01-03 NOTE — ED Triage Notes (Signed)
Pt presents today with c/o of pain/swelling to left knee x 3 weeks, with increase in pain beginning yesterday. Denies injury. +ambulatory, slow gait

## 2021-01-04 NOTE — ED Provider Notes (Signed)
[facility]    CSN: [id number] Arrival date & time: 01/03/21  4628      History   Chief Complaint Chief Complaint  Patient presents with   Knee Pain    left    HPI 30 year old male presents with left knee pain.  3 week history of left knee pain. Improved initially with tylenol. Worsened yesterday after doing a lot of activity. No fall or injury. Pain 9/10 in severity. He reports associated swelling. No other complaints or concerns at this time.   Past Medical History:  Diagnosis Date   Gout    Hypertension    Seasonal allergies    Past Surgical History:  Procedure Laterality Date   WISDOM TOOTH EXTRACTION     Home Medications    Prior to Admission medications   Medication Sig Start Date End Date Taking? Authorizing Provider  predniSONE (DELTASONE) 10 MG tablet 50 mg daily x 2 days, then 40 mg daily x 2 days, then 30 mg daily x 2 days, then 20 mg daily x 2 days, then 10 mg daily x 2 days. 01/03/21  Yes ,  G, DO  allopurinol (ZYLOPRIM) 100 MG tablet Take 100 mg by mouth daily. 05/16/20   [provider]  amLODipine (NORVASC) 10 MG tablet Take 10 mg by mouth daily.    [provider]  losartan (COZAAR) 50 MG tablet Take 50 mg by mouth daily.    [provider]  cetirizine (ZYRTEC ALLERGY) 10 MG tablet Take 1 tablet (10 mg total) by mouth daily. 01/28/17 05/04/19  Lutricia Feil, PA-C  colchicine 0.6 MG tablet Take 1 tablet by mouth as directed. 07/16/19 03/26/20  [provider]    Family History Family History  Problem Relation Age of Onset   Healthy Mother    Hypertension Father    Deep vein thrombosis Father     Social History Social History   Tobacco Use   Smoking status: Never   Smokeless tobacco: Never  Vaping Use   Vaping Use: Never used  Substance Use Topics   Alcohol use: Yes    Comment: occasional   Drug use: No     Allergies   Patient has no known allergies.   Review of Systems Review  of Systems  Constitutional: Negative.   Musculoskeletal:        Left knee pain.   Physical Exam Triage Vital Signs ED Triage Vitals  Enc Vitals Group     BP 01/03/21 1023 (!) 163/124     Pulse Rate 01/03/21 1023 (!) 108     Resp 01/03/21 1023 20     Temp 01/03/21 1023 98.6 F (37 C)     Temp Source 01/03/21 1023 Oral     SpO2 01/03/21 1023 98 %     Weight --      Height --      Head Circumference --      Peak Flow --      Pain Score 01/03/21 1020 9     Pain Loc --      Pain Edu? --      Excl. in GC? --    No data found.  Updated Vital Signs BP (!) 163/124 (BP Location: Left Arm) Comment: Pt last took BP meds 5 days ago  Pulse (!) 108   Temp 98.6 F (37 C) (Oral)   Resp 20   SpO2 98%   Visual Acuity Right Eye Distance:   Left Eye Distance:  Bilateral Distance:    Right Eye Near:   Left Eye Near:    Bilateral Near:     Physical Exam Constitutional:      General: He is not in acute distress.    Appearance: Normal appearance. He is obese.  HENT:     Head: Normocephalic and atraumatic.  Pulmonary:     Effort: Pulmonary effort is normal. No respiratory distress.  Musculoskeletal:     Comments: Left knee - no appreciable swelling. No joint line tenderness. Mild tenderness over the patella and superiorly.   Neurological:     Mental Status: He is alert.  Psychiatric:        Mood and Affect: Mood normal.        Behavior: Behavior normal.     UC Treatments / Results  Labs (all labs ordered are listed, but only abnormal results are displayed) Labs Reviewed - No data to display  EKG   Radiology DG Knee Complete 4 Views Left  Result Date: 01/03/2021 CLINICAL DATA:  Pain and swelling to LEFT knee for 3 weeks, increased pain since yesterday, slow gait, most pain adjacent to lateral patella EXAM: LEFT KNEE - COMPLETE 4+ VIEW COMPARISON:  None FINDINGS: Osseous mineralization normal. Joint spaces preserved. No acute fracture, dislocation, or bone destruction. No  joint effusion. IMPRESSION: Normal exam. Electronically Signed   By: Ulyses Southward M.D.   On: 01/03/2021 11:18    Procedures Procedures (including critical care time)  Medications Ordered in UC Medications - No data to display  Initial Impression / Assessment and Plan / UC Course  I have reviewed the triage vital signs and the nursing notes.  Pertinent labs & imaging results that were available during my care of the patient were reviewed by me and considered in my medical decision making (see chart for details).    30 year old male presents with acute left knee pain. Xray of the left knee was obtained and independently reviewed by me. Interpretation: Normal xray. No evidence of effusion or fracture. Overuse vs gout. Treating with prednisone.   Final Clinical Impressions(s) / UC Diagnoses   Final diagnoses:  Acute pain of left knee     Discharge Instructions      Xray was normal.  Rest, ice, elevation.  Medication as prescribed.  If persists, call [facility] clinic Orthopedics [phone]) OR [facility] [phone]) for an appt.    ED Prescriptions     Medication Sig Dispense Auth. Provider   predniSONE (DELTASONE) 10 MG tablet 50 mg daily x 2 days, then 40 mg daily x 2 days, then 30 mg daily x 2 days, then 20 mg daily x 2 days, then 10 mg daily x 2 days. 30 tablet Tommie Sams, DO      PDMP not reviewed this encounter.   Everlene Other [place], [place] 01/04/21 [phone]

## 2021-02-08 ENCOUNTER — Encounter: Payer: Self-pay | Admitting: Emergency Medicine

## 2021-02-08 ENCOUNTER — Emergency Department: Payer: No Typology Code available for payment source

## 2021-02-08 ENCOUNTER — Other Ambulatory Visit: Payer: Self-pay

## 2021-02-08 ENCOUNTER — Emergency Department
Admission: EM | Admit: 2021-02-08 | Discharge: 2021-02-08 | Disposition: A | Discharge status: Home / Self Care | Payer: No Typology Code available for payment source | Attending: Emergency Medicine | Admitting: Emergency Medicine

## 2021-02-08 DIAGNOSIS — M109 Gout, unspecified: Secondary | ICD-10-CM | POA: Diagnosis not present

## 2021-02-08 DIAGNOSIS — I10 Essential (primary) hypertension: Secondary | ICD-10-CM | POA: Diagnosis not present

## 2021-02-08 DIAGNOSIS — Z79899 Other long term (current) drug therapy: Secondary | ICD-10-CM | POA: Insufficient documentation

## 2021-02-08 DIAGNOSIS — M79662 Pain in left lower leg: Secondary | ICD-10-CM | POA: Diagnosis present

## 2021-02-08 LAB — CBC WITH DIFFERENTIAL/PLATELET
Abs Immature Granulocytes: 0.03 10*3/uL (ref 0.00–0.07)
Basophils Absolute: 0.1 10*3/uL (ref 0.0–0.1)
Basophils Relative: 1 %
Eosinophils Absolute: 0.2 10*3/uL (ref 0.0–0.5)
Eosinophils Relative: 1 %
HCT: 46 % (ref 39.0–52.0)
Hemoglobin: 14.8 g/dL (ref 13.0–17.0)
Immature Granulocytes: 0 %
Lymphocytes Relative: 30 %
Lymphs Abs: 3.2 10*3/uL (ref 0.7–4.0)
MCH: 28.4 pg (ref 26.0–34.0)
MCHC: 32.2 g/dL (ref 30.0–36.0)
MCV: 88.1 fL (ref 80.0–100.0)
Monocytes Absolute: 0.7 10*3/uL (ref 0.1–1.0)
Monocytes Relative: 7 %
Neutro Abs: 6.5 10*3/uL (ref 1.7–7.7)
Neutrophils Relative %: 61 %
Platelets: 346 10*3/uL (ref 150–400)
RBC: 5.22 MIL/uL (ref 4.22–5.81)
RDW: 13.2 % (ref 11.5–15.5)
WBC: 10.5 10*3/uL (ref 4.0–10.5)
nRBC: 0 % (ref 0.0–0.2)

## 2021-02-08 LAB — COMPREHENSIVE METABOLIC PANEL
ALT: 23 U/L (ref 0–44)
AST: 19 U/L (ref 15–41)
Albumin: 3.2 g/dL — ABNORMAL LOW (ref 3.5–5.0)
Alkaline Phosphatase: 54 U/L (ref 38–126)
Anion gap: 8 (ref 5–15)
BUN: 18 mg/dL (ref 6–20)
CO2: 28 mmol/L (ref 22–32)
Calcium: 9.1 mg/dL (ref 8.9–10.3)
Chloride: 101 mmol/L (ref 98–111)
Creatinine, Ser: 1.29 mg/dL — ABNORMAL HIGH (ref 0.61–1.24)
GFR, Estimated: >60 mL/min (ref >60)
Glucose, Bld: 89 mg/dL (ref 70–99)
Potassium: 4.4 mmol/L (ref 3.5–5.1)
Sodium: 137 mmol/L (ref 135–145)
Total Bilirubin: 0.5 mg/dL (ref 0.3–1.2)
Total Protein: 7.6 g/dL (ref 6.5–8.1)

## 2021-02-08 LAB — URIC ACID: Uric Acid, Serum: 9.8 mg/dL — ABNORMAL HIGH (ref 3.7–8.6)

## 2021-02-08 MED ORDER — PREDNISONE 20 MG PO TABS
60.0000 mg | ORAL_TABLET | Freq: Once | ORAL | Status: AC
  Administered 2021-02-08: 60 mg via ORAL
  Filled 2021-02-08: qty 3

## 2021-02-08 MED ORDER — PREDNISONE 10 MG (21) PO TBPK: ORAL_TABLET | ORAL | 0 refills | Status: DC

## 2021-02-08 NOTE — ED Triage Notes (Signed)
Pt reports that he thinks that he is having a gout attack, he is having pain in his left foot, no known injury, has had a gout before feels like it. It started Sunday and has gotten worse.

## 2021-02-08 NOTE — Discharge Instructions (Addendum)
Take tapered prednisone as directed.  

## 2021-02-08 NOTE — ED Provider Notes (Signed)
[facility]  ____________________________________________  Time seen: Approximately 3:45 PM  I have reviewed the triage vital signs and the nursing notes.   HISTORY  Chief Complaint Foot Pain   Historian Patient     HPI Carlos Oneal is a 30 y.o. male with a history of hypertension and gout, presents to the emergency department with swelling of the left ankle which extends to the left calf.  Patient states that he has been diagnosed with gout in the past and is concerned that he might be having a gout flare although this is an atypical presentation for him as he does not have any pain localized to the joint or erythema.  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  He denies pleuritic chest pain, shortness of breath, nausea, vomiting or abdominal pain.  He denies daily smoking and there is been no history of recent surgery.  No prior history of DVT or PE.   Past Medical History:  Diagnosis Date   Gout    Hypertension    Seasonal allergies      Immunizations up to date:  Yes.     Past Medical History:  Diagnosis Date   Gout    Hypertension    Seasonal allergies     There are no problems to display for this patient.   Past Surgical History:  Procedure Laterality Date   WISDOM TOOTH EXTRACTION      Prior to Admission medications   Medication Sig Start Date End Date Taking? Authorizing Provider  predniSONE (STERAPRED UNI-PAK 21 TAB) 10 MG (21) TBPK tablet Take 6 tablets the first day, take 5 tablets the second day, take 4 tablets the third day, take 3 tablets the fourth day, take 2 tablets the fifth day, take 1 tablet the sixth day. 02/08/21  Yes Pia Mau M, PA-C  allopurinol (ZYLOPRIM) 100 MG tablet Take 100 mg by mouth daily. 05/16/20   [provider]  amLODipine (NORVASC) 10 MG tablet Take 10 mg by mouth daily.    [provider]  losartan (COZAAR) 50 MG tablet Take 50 mg by mouth daily.    [provider]  cetirizine (ZYRTEC ALLERGY) 10 MG tablet Take 1 tablet (10 mg total) by mouth daily. 01/28/17 05/04/19  Lutricia Feil, PA-C  colchicine 0.6 MG tablet Take 1 tablet by mouth as directed. 07/16/19 03/26/20  [provider]    Allergies Patient has no known allergies.  Family History  Problem Relation Age of Onset   Healthy Mother    Hypertension Father    Deep vein thrombosis Father     Social History Social History   Tobacco Use   Smoking status: Never   Smokeless tobacco: Never  Vaping Use   Vaping Use: Never used  Substance Use Topics   Alcohol use: Yes    Comment: occasional   Drug use: No     Review of Systems  Constitutional: No fever/chills Eyes:  No discharge ENT: No upper respiratory complaints. Respiratory: no cough. No SOB/ use of accessory muscles to breath Gastrointestinal:   No nausea, no vomiting.  No diarrhea.  No constipation. Musculoskeletal: Negative for musculoskeletal pain. Skin: Patient has left ankle and calf swelling.     ____________________________________________   PHYSICAL EXAM:  VITAL SIGNS: ED Triage Vitals  Enc Vitals Group     BP 02/08/21 1205 (!) 149/121  Pulse Rate 02/08/21 1205 (!) 107     Resp 02/08/21 1205 18     Temp 02/08/21 1205 98.7 F (37.1 C)     Temp Source 02/08/21 1205 Oral     SpO2 02/08/21 1205 97 %     Weight 02/08/21 1206 (!) 360 lb (163.3 kg)     Height 02/08/21 1206 6\' 3"  (1.905 m)     Head Circumference --      Peak Flow --      Pain Score 02/08/21 1215 10     Pain Loc --      Pain Edu? --      Excl. in GC? --      Constitutional: Alert and oriented. Well appearing and in no acute distress. Eyes: Conjunctivae are normal. PERRL. EOMI. Head: Atraumatic. ENT:      Nose: No congestion/rhinnorhea.      Mouth/Throat: Mucous membranes are moist.  Neck: No stridor.  No cervical spine tenderness to  palpation. Cardiovascular: Normal rate, regular rhythm. Normal S1 and S2.  Good peripheral circulation. Respiratory: Normal respiratory effort without tachypnea or retractions. Lungs CTAB. Good air entry to the bases with no decreased or absent breath sounds Gastrointestinal: Bowel sounds x 4 quadrants. Soft and nontender to palpation. No guarding or rigidity. No distention. Musculoskeletal: Full range of motion to all extremities. No obvious deformities noted Neurologic:  Normal for age. No gross focal neurologic deficits are appreciated.  Skin: Patient has edema of the left lower extremity with 2+ pitting edema on the left from left ankle that extends to left calf. Psychiatric: Mood and affect are normal for age. Speech and behavior are normal.   ____________________________________________   LABS (all labs ordered are listed, but only abnormal results are displayed)  Labs Reviewed  COMPREHENSIVE METABOLIC PANEL - Abnormal; Notable for the following components:      Result Value   Creatinine, Ser 1.29 (*)    Albumin 3.2 (*)    All other components within normal limits  URIC ACID - Abnormal; Notable for the following components:   Uric Acid, Serum 9.8 (*)    All other components within normal limits  CBC WITH DIFFERENTIAL/PLATELET   ____________________________________________  EKG   ____________________________________________  RADIOLOGY 02/10/21, personally viewed and evaluated these images (plain radiographs) as part of my medical decision making, as well as reviewing the written report by the radiologist.  Geraldo Pitter Venous Img Lower Unilateral Left  Result Date: 02/08/2021 CLINICAL DATA:  leg swelling EXAM: LEFT LOWER EXTREMITY VENOUS DOPPLER ULTRASOUND TECHNIQUE: Gray-scale sonography with graded compression, as well as color Doppler and duplex ultrasound were performed to evaluate the lower extremity deep venous systems from the level of the common femoral vein and  including the common femoral, femoral, profunda femoral, popliteal and calf veins including the posterior tibial, peroneal and gastrocnemius veins when visible. The superficial great saphenous vein was also interrogated. Spectral Doppler was utilized to evaluate flow at rest and with distal augmentation maneuvers in the common femoral, femoral and popliteal veins. COMPARISON:  None. FINDINGS: Contralateral Common Femoral Vein: Respiratory phasicity is normal and symmetric with the symptomatic side. No evidence of thrombus. Normal compressibility. Common Femoral Vein: No evidence of thrombus. Normal compressibility, respiratory phasicity and response to augmentation. Saphenofemoral Junction: No evidence of thrombus. Normal compressibility and flow on color Doppler imaging. Profunda Femoral Vein: No evidence of thrombus. Normal compressibility and flow on color Doppler imaging. Femoral Vein: No evidence of thrombus. Normal compressibility, respiratory phasicity and  response to augmentation. Popliteal Vein: No evidence of thrombus. Normal compressibility, respiratory phasicity and response to augmentation. Calf Veins: No evidence of thrombus. Normal compressibility and flow on color Doppler imaging. Other Findings:  None. IMPRESSION: No evidence of deep venous thrombosis. Electronically Signed   By: Olive Bass M.D.   On: 02/08/2021 16:28    ____________________________________________    PROCEDURES  Procedure(s) performed:     Procedures     Medications  predniSONE (DELTASONE) tablet 60 mg (60 mg Oral Given 02/08/21 1653)     ____________________________________________   INITIAL IMPRESSION / ASSESSMENT AND PLAN / ED COURSE  Pertinent labs & imaging results that were available during my care of the patient were reviewed by me and considered in my medical decision making (see chart for details).      Assessment and Plan:  Gout flare 30 year old male presents to the emergency  department with concern for possible gout of the left lower extremity but was concerned about swelling of the left ankle and left calf which is an atypical presentation for him.  Patient vital signs are reassuring at triage.  Serum uric acid was elevated as well as mild elevation in creatinine.  CBC was reassuring.  Venous ultrasound showed no evidence of DVT.  Patient reports that he has responded well with prednisone in the past as opposed to colchicine.  He requested prednisone taper and prescription was written for him.  A work note was provided and all patient questions were answered.     ____________________________________________  FINAL CLINICAL IMPRESSION(S) / ED DIAGNOSES  Final diagnoses:  Acute gout of left ankle, unspecified cause      NEW MEDICATIONS STARTED DURING THIS VISIT:  ED Discharge Orders          Ordered    predniSONE (STERAPRED UNI-PAK 21 TAB) 10 MG (21) TBPK tablet        02/08/21 1648                This chart was dictated using voice recognition software/Dragon. Despite best efforts to proofread, errors can occur which can change the meaning. Any change was purely unintentional.     Orvil Feil, PA-C 02/08/21 1654    Phineas Semen, MD 02/08/21 1719

## 2022-03-31 ENCOUNTER — Ambulatory Visit
Admission: EM | Admit: 2022-03-31 | Discharge: 2022-03-31 | Disposition: A | Discharge status: Home / Self Care | Payer: 59 | Attending: Family Medicine | Admitting: Family Medicine

## 2022-03-31 DIAGNOSIS — M109 Gout, unspecified: Secondary | ICD-10-CM

## 2022-03-31 MED ORDER — PREDNISONE 20 MG PO TABS: 40.0000 mg | ORAL_TABLET | Freq: Every day | ORAL | 0 refills | Status: AC

## 2022-03-31 NOTE — ED Provider Notes (Signed)
[facility]    CSN: [id number] Arrival date & time: 03/31/22  1016      History   Chief Complaint Chief Complaint  Patient presents with   Foot Pain    Right foot pain. X3 days    HPI  HPI Carlos Oneal is a 31 y.o. male.   Carlos Oneal presents for moderately severe right ankle pain with swelling that started 3 days ago.  He has history of gout and was told by his PCP to be seen at the urgent care as he could not be seen in the office today.  He has an upcoming appointment with the PCP later this week.  He has run out of his colchicine and allopurinol.  Denies any injury.  Not had a fever.  States that there is no family history of autoimmune disease but his uncle and his dad both have gout.    Past Medical History:  Diagnosis Date   Gout    Hypertension    Seasonal allergies     There are no problems to display for this patient.   Past Surgical History:  Procedure Laterality Date   WISDOM TOOTH EXTRACTION         Home Medications    Prior to Admission medications   Medication Sig Start Date End Date Taking? Authorizing Provider  allopurinol (ZYLOPRIM) 100 MG tablet Take 100 mg by mouth daily. 05/16/20  Yes [provider]  amLODipine (NORVASC) 10 MG tablet Take 10 mg by mouth daily.   Yes [provider]  colchicine 0.6 MG tablet Take 2 tablets (1.2mg ) by mouth at first sign of gout flare followed by 1 tablet (0.6mg ) after 1 hour. (Max 1.8mg  within 1 hour) 02/24/21  Yes [provider]  losartan (COZAAR) 50 MG tablet Take 50 mg by mouth daily.   Yes [provider]  predniSONE (DELTASONE) 20 MG tablet Take 2 tablets (40 mg total) by mouth daily with breakfast for 5 days. 03/31/22 04/05/22 Yes , Ronnette Juniper, DO  cetirizine (ZYRTEC ALLERGY) 10 MG tablet Take 1 tablet (10 mg total) by mouth daily. 01/28/17 05/04/19  Lorin Picket, PA-C    Family History Family History  Problem Relation Age of Onset   Healthy  Mother    Hypertension Father    Deep vein thrombosis Father     Social History Social History   Tobacco Use   Smoking status: Never   Smokeless tobacco: Never  Vaping Use   Vaping Use: Never used  Substance Use Topics   Alcohol use: Yes    Comment: occasional   Drug use: No     Allergies   Patient has no known allergies.   Review of Systems Review of Systems: :negative unless otherwise stated in HPI.      Physical Exam Triage Vital Signs ED Triage Vitals  Enc Vitals Group     BP 03/31/22 1114 (!) 144/103     Pulse Rate 03/31/22 1114 94     Resp 03/31/22 1114 18     Temp 03/31/22 1114 98.3 F (36.8 C)     Temp Source 03/31/22 1114 Oral     SpO2 03/31/22 1114 97 %     Weight 03/31/22 1113 (!) 380 lb (172.4 kg)     Height 03/31/22 1113 6\' 1"  (1.854 m)     Head Circumference --      Peak Flow --      Pain Score 03/31/22 1113 7     Pain Loc --  Pain Edu? --      Excl. in GC? --    No data found.  Updated Vital Signs BP (!) 144/103 (BP Location: Left Arm)   Pulse 94   Temp 98.3 F (36.8 C) (Oral)   Resp 18   Ht 6\' 1"  (1.854 m)   Wt (!) 172.4 kg   SpO2 97%   BMI 50.13 kg/m   Visual Acuity Right Eye Distance:   Left Eye Distance:   Bilateral Distance:    Right Eye Near:   Left Eye Near:    Bilateral Near:     Physical Exam GEN: well appearing male in no acute distress  CVS: well perfused  RESP: speaking in full sentences without pause, no respiratory distress  MSK: Right ankle: Inspection: No erythema, +edema, no ecchymosis or bony deformity, + warmth Palpation: Tenderness to light palpation ROM: Limited range of motion due to swelling and pain  No ligamentous laxity No pain at the base of the fifth metatarsal Able to ambulate  -Neurovascularly intact, no instability noted    UC Treatments / Results  Labs (all labs ordered are listed, but only abnormal results are displayed) Labs Reviewed - No data to  display  EKG   Radiology No results found.  Procedures Procedures (including critical care time)  Medications Ordered in UC Medications - No data to display  Initial Impression / Assessment and Plan / UC Course  I have reviewed the triage vital signs and the nursing notes.  Pertinent labs & imaging results that were available during my care of the patient were reviewed by me and considered in my medical decision making (see chart for details).       Pt is a 31 y.o.  male with history of gout days of 3 days of right ankle pain.  On chart review, he has had an elevated uric acid levels in the past.  Reviewed ED visit from 02/08/2021.  Will tell fracture or or ankle sprain.  Given history and exam will defer labs and imaging at this time.  Treat patient for acute on chronic gout flare with steroid burst.  He is to follow-up as scheduled with his PCP.  Patient to gradually return to normal activities, as tolerated and continue ordinary activities within the limits permitted by pain.  Tylenol as needed for pain.  Advised patient to avoid other NSAIDs while taking steroids. Counseled patient on red flag symptoms and when to seek immediate care.  Return and ED precautions given.   Discussed MDM, treatment plan and plan for follow-up with patient who agrees with plan.   Final Clinical Impressions(s) / UC Diagnoses   Final diagnoses:  Gout of right ankle, unspecified cause, unspecified chronicity     Discharge Instructions      Stop by the pharmacy to pick up your prescriptions.  Follow up with your primary care provider, as scheduled.        ED Prescriptions     Medication Sig Dispense Auth. Provider   predniSONE (DELTASONE) 20 MG tablet Take 2 tablets (40 mg total) by mouth daily with breakfast for 5 days. 10 tablet 02/10/2021, DO      PDMP not reviewed this encounter.   Katha Cabal, DO 03/31/22 1201

## 2022-03-31 NOTE — Discharge Instructions (Addendum)
Stop by the pharmacy to pick up your prescriptions.  Follow up with your primary care provider, as scheduled.

## 2022-03-31 NOTE — ED Triage Notes (Signed)
Pt states that he has some right foot pain. X3 days  Pt states that he does have a history of Gout.

## 2022-12-03 ENCOUNTER — Emergency Department
Admission: EM | Admit: 2022-12-03 | Discharge: 2022-12-03 | Disposition: A | Discharge status: Home / Self Care | Payer: 59 | Attending: Student in an Organized Health Care Education/Training Program | Admitting: Student in an Organized Health Care Education/Training Program

## 2022-12-03 ENCOUNTER — Other Ambulatory Visit: Payer: Self-pay

## 2022-12-03 DIAGNOSIS — M79671 Pain in right foot: Secondary | ICD-10-CM | POA: Diagnosis not present

## 2022-12-03 DIAGNOSIS — M79672 Pain in left foot: Secondary | ICD-10-CM | POA: Insufficient documentation

## 2022-12-03 LAB — BASIC METABOLIC PANEL
Anion gap: 9 (ref 5–15)
BUN: 23 mg/dL — ABNORMAL HIGH (ref 6–20)
CO2: 23 mmol/L (ref 22–32)
Calcium: 8.9 mg/dL (ref 8.9–10.3)
Chloride: 104 mmol/L (ref 98–111)
Creatinine, Ser: 1.71 mg/dL — ABNORMAL HIGH (ref 0.61–1.24)
GFR, Estimated: 54 mL/min — ABNORMAL LOW (ref >60)
Glucose, Bld: 89 mg/dL (ref 70–99)
Potassium: 4.4 mmol/L (ref 3.5–5.1)
Sodium: 136 mmol/L (ref 135–145)

## 2022-12-03 MED ORDER — OXYCODONE-ACETAMINOPHEN 5-325 MG PO TABS: 1.0000 | ORAL_TABLET | ORAL | 0 refills | Status: DC | PRN

## 2022-12-03 MED ORDER — OXYCODONE-ACETAMINOPHEN 5-325 MG PO TABS
1.0000 | ORAL_TABLET | Freq: Once | ORAL | Status: AC
  Administered 2022-12-03: 1 via ORAL
  Filled 2022-12-03: qty 1

## 2022-12-03 MED ORDER — COLCHICINE 0.6 MG PO TABS: ORAL_TABLET | ORAL | 0 refills | Status: DC

## 2022-12-03 MED ORDER — COLCHICINE 0.6 MG PO TABS
1.2000 mg | ORAL_TABLET | Freq: Every day | ORAL | Status: DC
  Administered 2022-12-03: 1.2 mg via ORAL
  Filled 2022-12-03: qty 2

## 2022-12-03 MED ORDER — METHYLPREDNISOLONE 4 MG PO TBPK: ORAL_TABLET | ORAL | 0 refills | Status: DC

## 2022-12-03 NOTE — ED Triage Notes (Signed)
Pt to ed from home via POV for bilateral foot pain. 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 Pt is caox4, in no acute distress and in wheel chair in triage.

## 2022-12-03 NOTE — ED Provider Notes (Signed)
[facility] Provider Note    Event Date/Time   First MD Initiated Contact with Patient 12/03/22 2113     (approximate)   History   Foot Pain (bilateral)   HPI  Carlos Oneal is a 32 y.o. male with a history of gout presents to the ER for evaluation of bilateral foot pain.  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  He went to hospital in [place] where he had imaging done which did not show any evidence of fracture he had a laceration to the left minor toe that was repaired and was diagnosed with ankle sprain.  He feels that over the past 24 hours has been having worsening pain.  Feels that a lot of it is similar to gouty arthritis type pain.  Denies any fevers.  Was not given anything for pain medication to go home on.     Physical Exam   Triage Vital Signs: ED Triage Vitals [12/03/22 1903]  Enc Vitals Group     BP (S) (!) 175/122     Pulse Rate (!) 120     Resp 16     Temp 97.7 F (36.5 C)     Temp Source Oral     SpO2 98 %     Weight (!) 379 lb 3.1 oz (172 kg)     Height 6\' 1"  (1.854 m)     Head Circumference      Peak Flow      Pain Score 6     Pain Loc      Pain Edu?      Excl. in GC?     Most recent vital signs: Vitals:   12/03/22 1903  BP: (S) (!) 175/122  Pulse: (!) 120  Resp: 16  Temp: 97.7 F (36.5 C)  SpO2: 98%     Constitutional: Alert  Eyes: Conjunctivae are normal.  Head: Atraumatic. Nose: No congestion/rhinnorhea. Mouth/Throat: Mucous membranes are moist.   Neck: Painless ROM.  Cardiovascular:   Good peripheral circulation. Respiratory: Normal respiratory effort.  No retractions.  Gastrointestinal: Soft and nontender.  Musculoskeletal: Swelling and ecchymosis of the lateral aspect of the right foot and ankle consistent with sprain.  Neurovascular intact distally.  Laceration of the left  minor toe well-approximated without evidence of purulence or associated erythema or infection.  Neurovascular intact distally. Neurologic:  MAE spontaneously. No gross focal neurologic deficits are appreciated.  Skin:  Skin is warm, dry and intact. No rash noted. Psychiatric: Mood and affect are normal. Speech and behavior are normal.    ED Results / Procedures / Treatments   Labs (all labs ordered are listed, but only abnormal results are displayed) Labs Reviewed  BASIC METABOLIC PANEL - Abnormal; Notable for the following components:      Result Value   BUN 23 (*)    Creatinine, Ser 1.71 (*)    GFR, Estimated 54 (*)    All other components within normal limits     EKG     RADIOLOGY    PROCEDURES:  Critical Care performed:   Procedures   MEDICATIONS ORDERED IN ED: Medications  colchicine tablet 1.2 mg (has no administration in time range)  oxyCODONE-acetaminophen (PERCOCET/ROXICET) 5-325 MG per tablet 1 tablet (1 tablet Oral Given 12/03/22 2136)     IMPRESSION / MDM / ASSESSMENT AND PLAN / ED COURSE  I  reviewed the triage vital signs and the nursing notes.                              Differential diagnosis includes, but is not limited to, sprain, contusion, gouty arthritis, laceration  Patient presenting for persistent pain after injury in the setting of history of gouty arthritis having pain that he feels is consistent with gout and would take his colchicine if he had it.  Also has diagnosis of sprain with radiographs from yesterday being negative.  He is already in fracture boot and Ace wrap was exchanged with improvement in symptoms.  He felt that the ace wrap previously was put on incorrectly.  I do not feel that repeat imaging clinically indicated at this time.  Will give pain medication.  BMP checked to make sure the patient does not have renal insufficiency that would preclude represcribing his colchicine.  Patient does appear stable and appropriate for  outpatient follow-up.       FINAL CLINICAL IMPRESSION(S) / ED DIAGNOSES   Final diagnoses:  Foot pain, bilateral     Rx / DC Orders   ED Discharge Orders          Ordered    colchicine 0.6 MG tablet        12/03/22 2319    oxyCODONE-acetaminophen (PERCOCET) 5-325 MG tablet  Every 4 hours PRN        12/03/22 2319    methylPREDNISolone (MEDROL DOSEPAK) 4 MG TBPK tablet        12/03/22 2319             Note:  This document was prepared using Dragon voice recognition software and may include unintentional dictation errors.    Willy Eddy, MD 12/03/22 2322

## 2023-01-01 ENCOUNTER — Other Ambulatory Visit: Payer: Self-pay

## 2023-01-01 ENCOUNTER — Emergency Department
Admission: EM | Admit: 2023-01-01 | Discharge: 2023-01-02 | Disposition: A | Discharge status: Home / Self Care | Payer: 59 | Source: Home / Self Care | Attending: Emergency Medicine | Admitting: Emergency Medicine

## 2023-01-01 ENCOUNTER — Encounter: Payer: Self-pay | Admitting: Emergency Medicine

## 2023-01-01 ENCOUNTER — Emergency Department: Payer: 59

## 2023-01-01 DIAGNOSIS — G6289 Other specified polyneuropathies: Secondary | ICD-10-CM | POA: Insufficient documentation

## 2023-01-01 DIAGNOSIS — G629 Polyneuropathy, unspecified: Secondary | ICD-10-CM

## 2023-01-01 DIAGNOSIS — R0789 Other chest pain: Secondary | ICD-10-CM | POA: Diagnosis present

## 2023-01-01 LAB — BASIC METABOLIC PANEL
Anion gap: 10 (ref 5–15)
BUN: 19 mg/dL (ref 6–20)
CO2: 23 mmol/L (ref 22–32)
Calcium: 9.7 mg/dL (ref 8.9–10.3)
Chloride: 101 mmol/L (ref 98–111)
Creatinine, Ser: 1.77 mg/dL — ABNORMAL HIGH (ref 0.61–1.24)
GFR, Estimated: 52 mL/min — ABNORMAL LOW (ref >60)
Glucose, Bld: 109 mg/dL — ABNORMAL HIGH (ref 70–99)
Potassium: 4.5 mmol/L (ref 3.5–5.1)
Sodium: 134 mmol/L — ABNORMAL LOW (ref 135–145)

## 2023-01-01 LAB — CBC
HCT: 49.6 % (ref 39.0–52.0)
Hemoglobin: 15.3 g/dL (ref 13.0–17.0)
MCH: 27.3 pg (ref 26.0–34.0)
MCHC: 30.8 g/dL (ref 30.0–36.0)
MCV: 88.4 fL (ref 80.0–100.0)
Platelets: 218 10*3/uL (ref 150–400)
RBC: 5.61 MIL/uL (ref 4.22–5.81)
RDW: 13.2 % (ref 11.5–15.5)
WBC: 8 10*3/uL (ref 4.0–10.5)
nRBC: 0 % (ref 0.0–0.2)

## 2023-01-01 LAB — TROPONIN I (HIGH SENSITIVITY): Troponin I (High Sensitivity): 4 ng/L (ref <18)

## 2023-01-01 NOTE — ED Notes (Signed)
ED Provider at bedside. 

## 2023-01-01 NOTE — ED Triage Notes (Signed)
Patient to ED via POV for  intermittent left sided CP. Denies cardiac hx. Also having right foot pain from accident from 1 month- seen here then and currently in physical therapy for same. Also, having bilateral tinging in feet. Hx of hypertension and gout.

## 2023-01-02 MED ORDER — GABAPENTIN 100 MG PO CAPS
100.0000 mg | ORAL_CAPSULE | Freq: Once | ORAL | Status: AC
  Administered 2023-01-02: 100 mg via ORAL
  Filled 2023-01-02: qty 1

## 2023-01-02 MED ORDER — GABAPENTIN 100 MG PO CAPS: 100.0000 mg | ORAL_CAPSULE | Freq: Three times a day (TID) | ORAL | 2 refills | Status: AC

## 2023-01-02 NOTE — ED Provider Notes (Signed)
[facility] Provider Note   Event Date/Time   First MD Initiated Contact with Patient 01/01/23 2347     (approximate) History  Chest Pain  HPI Carlos Oneal is a 32 y.o. male with stated past medical history of gout who presents complaining of intermittent left-sided chest pain and bilateral lower extremity paresthesias over the past few weeks.  Patient states that these symptoms of paresthesias began after a traumatic fall on 7/7 suffering no fracture or dislocation but with a presumed gout flare.  Patient states that he has had intermittent tingling to bilateral feet that cause him to feel like he is "walking on pins-and-needles".  Patient denies any exacerbating or relieving factors for the symptoms. ROS: Patient currently denies any vision changes, tinnitus, difficulty speaking, facial droop, sore throat, chest pain, shortness of breath, abdominal pain, nausea/vomiting/diarrhea, dysuria, or weakness/numbness in any extremity   Physical Exam  Triage Vital Signs: ED Triage Vitals  Encounter Vitals Group     BP 01/01/23 1557 (!) 149/106     Systolic BP Percentile --      Diastolic BP Percentile --      Pulse Rate 01/01/23 1557 (!) 112     Resp 01/01/23 1557 18     Temp 01/01/23 1557 98.3 F (36.8 C)     Temp Source 01/01/23 1557 Oral     SpO2 01/01/23 1557 97 %     Weight 01/01/23 1558 (!) 396 lb (179.6 kg)     Height 01/01/23 1558 6\' 1"  (1.854 m)     Head Circumference --      Peak Flow --      Pain Score 01/01/23 1558 7     Pain Loc --      Pain Education --      Exclude from Growth Chart --    Most recent vital signs: Vitals:   01/01/23 2145 01/01/23 2355  BP: (!) 148/108 (!) 136/96  Pulse: 92 88  Resp: 19 18  Temp: 98.6 F (37 C) 98.6 F (37 C)  SpO2: 98% 100%   General: Awake, oriented x4. CV:  Good peripheral perfusion.  Resp:  Normal effort.  Abd:  No distention.  Other:  Middle-age morbidly obese African-American male laying in  bed in no acute distress.  Surgical walking shoe to bilateral lower extremities with edema and no evidence of gouty tophi at this time ED Results / Procedures / Treatments  Labs (all labs ordered are listed, but only abnormal results are displayed) Labs Reviewed  BASIC METABOLIC PANEL - Abnormal; Notable for the following components:      Result Value   Sodium 134 (*)    Glucose, Bld 109 (*)    Creatinine, Ser 1.77 (*)    GFR, Estimated 52 (*)    All other components within normal limits  CBC  TROPONIN I (HIGH SENSITIVITY)  TROPONIN I (HIGH SENSITIVITY)   EKG ED ECG REPORT I, Merwyn Katos, the attending physician, personally viewed and interpreted this ECG. Date: 01/02/2023 EKG Time: 1603 Rate: 110 Rhythm: Tachycardic sinus rhythm QRS Axis: normal Intervals: normal ST/T Wave abnormalities: normal Narrative Interpretation: Tachycardic sinus rhythm.  No evidence of acute ischemia RADIOLOGY ED MD interpretation: 2 view chest x-ray interpreted by me shows no evidence of acute abnormalities including no pneumonia, pneumothorax, or widened mediastinum -Agree with radiology assessment Official radiology report(s): DG Chest 2 View  Result Date: 01/01/2023 CLINICAL DATA:  Chest pain EXAM: CHEST - 2 VIEW COMPARISON:  X-ray  05/26/2019 FINDINGS: No consolidation, pneumothorax or effusion. No edema. Normal cardiopericardial silhouette. Films are under penetrated. Air-fluid level along the stomach beneath the left hemidiaphragm. IMPRESSION: No acute cardiopulmonary disease. Electronically Signed   By: Karen Kays M.D.   On: 01/01/2023 16:37   PROCEDURES: Critical Care performed: No .1-3 Lead EKG Interpretation  Performed by: Merwyn Katos, MD Authorized by: Merwyn Katos, MD     Interpretation: normal     ECG rate:  71   ECG rate assessment: normal     Rhythm: sinus rhythm     Ectopy: none     Conduction: normal    MEDICATIONS ORDERED IN ED: Medications  gabapentin  (NEURONTIN) capsule 100 mg (100 mg Oral Given 01/02/23 0005)   IMPRESSION / MDM / ASSESSMENT AND PLAN / ED COURSE  I reviewed the triage vital signs and the nursing notes.                             The patient is on the cardiac monitor to evaluate for evidence of arrhythmia and/or significant heart rate changes. Patient's presentation is most consistent with acute presentation with potential threat to life or bodily function. Workup: ECG, CXR, CBC, BMP, Troponin Findings: ECG: No overt evidence of STEMI. No evidence of Brugadas sign, delta wave, epsilon wave, significantly prolonged QTc, or malignant arrhythmia HS Troponin: Negative x1 Other Labs unremarkable for emergent problems. CXR: Without PTX, PNA, or widened mediastinum Last Stress Test: Never Last Heart Catheterization: Never HEART Score: 3  Given History, Exam, and Workup I have low suspicion for ACS, Pneumothorax, Pneumonia, Pulmonary Embolus, Tamponade, Aortic Dissection or other emergent problem as a cause for this presentation.   Reassesment: Prior to discharge patients pain was controlled and they were well appearing.  Disposition:  Discharge. Strict return precautions discussed with patient with full understanding. Advised patient to follow up promptly with primary care provider    FINAL CLINICAL IMPRESSION(S) / ED DIAGNOSES   Final diagnoses:  Peripheral polyneuropathy  Atypical chest pain   Rx / DC Orders   ED Discharge Orders          Ordered    gabapentin (NEURONTIN) 100 MG capsule  3 times daily        01/02/23 0001           Note:  This document was prepared using Dragon voice recognition software and may include unintentional dictation errors.   Merwyn Katos, MD 01/02/23 [phone]

## 2023-01-11 ENCOUNTER — Other Ambulatory Visit: Payer: Self-pay | Admitting: Emergency Medicine

## 2023-01-11 ENCOUNTER — Encounter: Payer: Self-pay | Admitting: Emergency Medicine

## 2023-01-11 DIAGNOSIS — S93401A Sprain of unspecified ligament of right ankle, initial encounter: Secondary | ICD-10-CM

## 2023-01-16 ENCOUNTER — Ambulatory Visit
Admission: EM | Admit: 2023-01-16 | Discharge: 2023-01-16 | Disposition: A | Discharge status: Home / Self Care | Payer: 59 | Attending: Emergency Medicine | Admitting: Emergency Medicine

## 2023-01-16 ENCOUNTER — Other Ambulatory Visit: Payer: Self-pay

## 2023-01-16 DIAGNOSIS — U071 COVID-19: Secondary | ICD-10-CM

## 2023-01-16 LAB — SARS CORONAVIRUS 2 BY RT PCR: SARS Coronavirus 2 by RT PCR: POSITIVE — AB

## 2023-01-16 MED ORDER — BENZONATATE 100 MG PO CAPS: 200.0000 mg | ORAL_CAPSULE | Freq: Three times a day (TID) | ORAL | 0 refills | Status: DC

## 2023-01-16 MED ORDER — NIRMATRELVIR/RITONAVIR (PAXLOVID) TABLET (RENAL DOSING): 2.0000 | ORAL_TABLET | Freq: Two times a day (BID) | ORAL | 0 refills | Status: AC

## 2023-01-16 MED ORDER — IPRATROPIUM BROMIDE 0.06 % NA SOLN: 2.0000 | Freq: Four times a day (QID) | NASAL | 12 refills | Status: DC

## 2023-01-16 MED ORDER — PROMETHAZINE-DM 6.25-15 MG/5ML PO SYRP: 5.0000 mL | ORAL_SOLUTION | Freq: Four times a day (QID) | ORAL | 0 refills | Status: DC | PRN

## 2023-01-16 NOTE — Discharge Instructions (Addendum)
CDC guidelines state that you must wear a mask for the first 5 days of symptoms when you are around other people.  After 5 days you no longer need to mask as you are no longer considered infectious.  There is no longer need to quarantine unless you have a fever.  If you do have a fever then you need to quarantine until you have been fever free for 24 hours without taking Tylenol and/or ibuprofen.  Use over-the-counter Tylenol and/or ibuprofen according to the package instructions as needed for fever and pain.  Use the Atrovent nasal spray, 2 squirts up each nostril every 6 hours, as needed for nasal congestion and runny nose.  Use the Tessalon Perles every 8 hours during the day as needed for cough.  Take them with a small sip of water.  You may experience numbness to the base of your tongue or metallic taste in her mouth, this is normal.  Use the Promethazine DM cough syrup at bedtime as needed for cough and congestion.  Be mindful this medication will make you sleepy.  If you have been prescribed Paxlovid take it twice daily as prescribed for 5 days.  Do not take your colchicine while taking the Paxlovid.  With regards to your ongoing back and leg issues I recommend that you follow-up with your Worker's Comp. provider as we discussed.  If you develop any worsening respiratory symptoms such as shortness of breath, shortness of breath at rest, feel as though you cannot catch your breath, you are unable to speak in full sentences, or, as a late sign, your lips begin turning blue you need to call 911 and go to the ER for evaluation.

## 2023-01-16 NOTE — ED Provider Notes (Signed)
[facility]    CSN: [id number] Arrival date & time: 01/16/23  1714      History   Chief Complaint Chief Complaint  Patient presents with   Cough   Nasal Congestion   Dizziness   Fatigue    HPI Carlos Oneal is a 32 y.o. male.   HPI  32 year old male with past medical history significant for hypertension and gout presents for evaluation of 3 days worth of respiratory symptoms which include runny nose, nasal congestion, ear pain, sore throat, and productive cough.  He denies any fever, shortness breath, wheezing, or known sick contacts.  The patient also reports that he has been experiencing ongoing pain in his right ankle as well as pain in his low back and tingling in his right leg.  This is a Teacher, adult education. injury and he is already seeing physical therapy and a provider regarding this injury.  I have told him that we will not evaluate that in the urgent care as it is a current Worker's Comp. case and he needs to follow-up with his provider.  Past Medical History:  Diagnosis Date   Gout    Hypertension    Seasonal allergies     There are no problems to display for this patient.   Past Surgical History:  Procedure Laterality Date   WISDOM TOOTH EXTRACTION         Home Medications    Prior to Admission medications   Medication Sig Start Date End Date Taking? Authorizing Provider  allopurinol (ZYLOPRIM) 100 MG tablet Take 100 mg by mouth daily. 05/16/20  Yes [provider]  amLODipine (NORVASC) 10 MG tablet Take 10 mg by mouth daily.   Yes [provider]  benzonatate (TESSALON) 100 MG capsule Take 2 capsules (200 mg total) by mouth every 8 (eight) hours. 01/16/23  Yes Becky Augusta, NP  colchicine 0.6 MG tablet Take 2 tablets (1.2mg ) by mouth at first sign of gout flare followed by 1 tablet (0.6mg ) after 1 hour. (Max 1.8mg  within 1 hour) 12/03/22  Yes Willy Eddy, MD  gabapentin (NEURONTIN) 100 MG capsule Take 1 capsule (100 mg  total) by mouth 3 (three) times daily. 01/02/23 01/02/24 Yes Merwyn Katos, MD  ipratropium (ATROVENT) 0.06 % nasal spray Place 2 sprays into both nostrils 4 (four) times daily. 01/16/23  Yes Becky Augusta, NP  nirmatrelvir/ritonavir, renal dosing, (PAXLOVID) 10 x 150 MG & 10 x 100MG  TABS Take 2 tablets by mouth 2 (two) times daily for 5 days. Patient GFR is 52. Take nirmatrelvir (150 mg) one tablet twice daily for 5 days and ritonavir (100 mg) one tablet twice daily for 5 days. 01/16/23 01/21/23 Yes Becky Augusta, NP  promethazine-dextromethorphan (PROMETHAZINE-DM) 6.25-15 MG/5ML syrup Take 5 mLs by mouth 4 (four) times daily as needed. 01/16/23  Yes Becky Augusta, NP  cetirizine (ZYRTEC ALLERGY) 10 MG tablet Take 1 tablet (10 mg total) by mouth daily. 01/28/17 05/04/19  Lutricia Feil, PA-C    Family History Family History  Problem Relation Age of Onset   Healthy Mother    Hypertension Father    Deep vein thrombosis Father     Social History Social History   Tobacco Use   Smoking status: Never   Smokeless tobacco: Never  Vaping Use   Vaping status: Never Used  Substance Use Topics   Alcohol use: Yes    Comment: occasional   Drug use: No     Allergies   Bee venom   Review  of Systems Review of Systems  Constitutional:  Negative for fever.  HENT:  Positive for congestion, ear pain, rhinorrhea and sore throat.   Respiratory:  Positive for cough. Negative for shortness of breath and wheezing.      Physical Exam Triage Vital Signs ED Triage Vitals  Encounter Vitals Group     BP      Systolic BP Percentile      Diastolic BP Percentile      Pulse      Resp      Temp      Temp src      SpO2      Weight      Height      Head Circumference      Peak Flow      Pain Score      Pain Loc      Pain Education      Exclude from Growth Chart    No data found.  Updated Vital Signs BP (!) 136/94   Pulse (!) 111   Temp 98.5 F (36.9 C)   Resp 20   SpO2 97%   Visual  Acuity Right Eye Distance:   Left Eye Distance:   Bilateral Distance:    Right Eye Near:   Left Eye Near:    Bilateral Near:     Physical Exam Vitals and nursing note reviewed.  Constitutional:      Appearance: Normal appearance. He is not ill-appearing.  HENT:     Head: Normocephalic and atraumatic.     Right Ear: Tympanic membrane, ear canal and external ear normal. There is no impacted cerumen.     Left Ear: Tympanic membrane, ear canal and external ear normal. There is no impacted cerumen.     Nose: Congestion and rhinorrhea present.     Comments: Nasal mucosa is erythematous and edematous with clear discharge in both nares.    Mouth/Throat:     Mouth: Mucous membranes are moist.     Pharynx: Oropharynx is clear. Posterior oropharyngeal erythema present. No oropharyngeal exudate.     Comments: Mild erythema to the posterior oropharynx with clear postnasal drip. Cardiovascular:     Rate and Rhythm: Normal rate and regular rhythm.     Pulses: Normal pulses.     Heart sounds: Normal heart sounds. No murmur heard.    No friction rub. No gallop.  Pulmonary:     Effort: Pulmonary effort is normal.     Breath sounds: Normal breath sounds. No wheezing, rhonchi or rales.  Skin:    General: Skin is warm and dry.     Capillary Refill: Capillary refill takes less than 2 seconds.     Findings: No rash.  Neurological:     General: No focal deficit present.     Mental Status: He is alert and oriented to person, place, and time.      UC Treatments / Results  Labs (all labs ordered are listed, but only abnormal results are displayed) Labs Reviewed  SARS CORONAVIRUS 2 BY RT PCR - Abnormal; Notable for the following components:      Result Value   SARS Coronavirus 2 by RT PCR POSITIVE (*)    All other components within normal limits    EKG   Radiology No results found.  Procedures Procedures (including critical care time)  Medications Ordered in UC Medications - No data  to display  Initial Impression / Assessment and Plan / UC Course  I have reviewed  the triage vital signs and the nursing notes.  Pertinent labs & imaging results that were available during my care of the patient were reviewed by me and considered in my medical decision making (see chart for details).   Patient is a nontoxic-appearing 32 year old male presenting for evaluation of 3 days with respiratory symptoms as outlined HPI above.  He does have inflammation of his nasal mucosa with clear nasal discharge and clear postnasal drip.  Cardiopulmonary exam reveals clear lung sounds in all fields.  I will order a COVID PCR.  COVID PCR is positive.  I will discharge patient on the diagnosis of COVID-19 and start him on Paxlovid twice daily for 5 days.  CMP from 01/01/2023 shows his GFR is 52 so I will discharge him home on the renal dosing.  I will also prescribe Atrovent nasal spray, Tessalon Perles, and Promethazine DM cough syrup.  ER and return precautions reviewed.  Work note provided.   Final Clinical Impressions(s) / UC Diagnoses   Final diagnoses:  COVID-19     Discharge Instructions      CDC guidelines state that you must wear a mask for the first 5 days of symptoms when you are around other people.  After 5 days you no longer need to mask as you are no longer considered infectious.  There is no longer need to quarantine unless you have a fever.  If you do have a fever then you need to quarantine until you have been fever free for 24 hours without taking Tylenol and/or ibuprofen.  Use over-the-counter Tylenol and/or ibuprofen according to the package instructions as needed for fever and pain.  Use the Atrovent nasal spray, 2 squirts up each nostril every 6 hours, as needed for nasal congestion and runny nose.  Use the Tessalon Perles every 8 hours during the day as needed for cough.  Take them with a small sip of water.  You may experience numbness to the base of your tongue or  metallic taste in her mouth, this is normal.  Use the Promethazine DM cough syrup at bedtime as needed for cough and congestion.  Be mindful this medication will make you sleepy.  If you have been prescribed Paxlovid take it twice daily as prescribed for 5 days.  Do not take your colchicine while taking the Paxlovid.  With regards to your ongoing back and leg issues I recommend that you follow-up with your Worker's Comp. provider as we discussed.  If you develop any worsening respiratory symptoms such as shortness of breath, shortness of breath at rest, feel as though you cannot catch your breath, you are unable to speak in full sentences, or, as a late sign, your lips begin turning blue you need to call 911 and go to the ER for evaluation.      ED Prescriptions     Medication Sig Dispense Auth. Provider   benzonatate (TESSALON) 100 MG capsule Take 2 capsules (200 mg total) by mouth every 8 (eight) hours. 21 capsule Becky Augusta, NP   ipratropium (ATROVENT) 0.06 % nasal spray Place 2 sprays into both nostrils 4 (four) times daily. 15 mL Becky Augusta, NP   nirmatrelvir/ritonavir, renal dosing, (PAXLOVID) 10 x 150 MG & 10 x 100MG  TABS Take 2 tablets by mouth 2 (two) times daily for 5 days. Patient GFR is 52. Take nirmatrelvir (150 mg) one tablet twice daily for 5 days and ritonavir (100 mg) one tablet twice daily for 5 days. 20 tablet Becky Augusta, NP  promethazine-dextromethorphan (PROMETHAZINE-DM) 6.25-15 MG/5ML syrup Take 5 mLs by mouth 4 (four) times daily as needed. 118 mL Becky Augusta, NP      PDMP not reviewed this encounter.   Becky Augusta, NP 01/16/23 [phone]

## 2023-01-16 NOTE — ED Triage Notes (Signed)
Pt has concern for COVID symptoms that started 3 days ago with cough, congestion and dizziness. Denies fevers at this time. Pt fell on July 6 and injured his right ankle xr was done and read as negative but pt continues to have sharp and tingling in right leg with bilat lower back pain

## 2023-01-19 ENCOUNTER — Ambulatory Visit
Admission: RE | Admit: 2023-01-19 | Discharge: 2023-01-19 | Disposition: A | Discharge status: Home / Self Care | Payer: Worker's Compensation | Source: Ambulatory Visit | Attending: Emergency Medicine | Admitting: Emergency Medicine

## 2023-01-19 DIAGNOSIS — S93401A Sprain of unspecified ligament of right ankle, initial encounter: Secondary | ICD-10-CM

## 2023-01-24 ENCOUNTER — Other Ambulatory Visit: Payer: Self-pay

## 2023-01-24 ENCOUNTER — Emergency Department
Admission: EM | Admit: 2023-01-24 | Discharge: 2023-01-25 | Disposition: A | Discharge status: Home / Self Care | Payer: 59 | Attending: Emergency Medicine | Admitting: Emergency Medicine

## 2023-01-24 DIAGNOSIS — M109 Gout, unspecified: Secondary | ICD-10-CM | POA: Diagnosis not present

## 2023-01-24 DIAGNOSIS — M7989 Other specified soft tissue disorders: Secondary | ICD-10-CM | POA: Diagnosis present

## 2023-01-24 DIAGNOSIS — Z8616 Personal history of COVID-19: Secondary | ICD-10-CM | POA: Insufficient documentation

## 2023-01-24 DIAGNOSIS — R600 Localized edema: Secondary | ICD-10-CM | POA: Insufficient documentation

## 2023-01-24 DIAGNOSIS — N189 Chronic kidney disease, unspecified: Secondary | ICD-10-CM | POA: Insufficient documentation

## 2023-01-24 LAB — BASIC METABOLIC PANEL
Anion gap: 12 (ref 5–15)
BUN: 21 mg/dL — ABNORMAL HIGH (ref 6–20)
CO2: 23 mmol/L (ref 22–32)
Calcium: 9.2 mg/dL (ref 8.9–10.3)
Chloride: 102 mmol/L (ref 98–111)
Creatinine, Ser: 1.79 mg/dL — ABNORMAL HIGH (ref 0.61–1.24)
GFR, Estimated: 51 mL/min — ABNORMAL LOW (ref >60)
Glucose, Bld: 97 mg/dL (ref 70–99)
Potassium: 4.4 mmol/L (ref 3.5–5.1)
Sodium: 137 mmol/L (ref 135–145)

## 2023-01-24 LAB — CBC WITH DIFFERENTIAL/PLATELET
Abs Immature Granulocytes: 0.04 10*3/uL (ref 0.00–0.07)
Basophils Absolute: 0.1 10*3/uL (ref 0.0–0.1)
Basophils Relative: 1 %
Eosinophils Absolute: 0.1 10*3/uL (ref 0.0–0.5)
Eosinophils Relative: 2 %
HCT: 48.5 % (ref 39.0–52.0)
Hemoglobin: 15.4 g/dL (ref 13.0–17.0)
Immature Granulocytes: 0 %
Lymphocytes Relative: 38 %
Lymphs Abs: 3.6 10*3/uL (ref 0.7–4.0)
MCH: 27.7 pg (ref 26.0–34.0)
MCHC: 31.8 g/dL (ref 30.0–36.0)
MCV: 87.4 fL (ref 80.0–100.0)
Monocytes Absolute: 0.8 10*3/uL (ref 0.1–1.0)
Monocytes Relative: 8 %
Neutro Abs: 4.9 10*3/uL (ref 1.7–7.7)
Neutrophils Relative %: 51 %
Platelets: 363 10*3/uL (ref 150–400)
RBC: 5.55 MIL/uL (ref 4.22–5.81)
RDW: 12.8 % (ref 11.5–15.5)
WBC: 9.5 10*3/uL (ref 4.0–10.5)
nRBC: 0 % (ref 0.0–0.2)

## 2023-01-24 LAB — MAGNESIUM: Magnesium: 2.1 mg/dL (ref 1.7–2.4)

## 2023-01-24 LAB — CK: Total CK: 148 U/L (ref 49–397)

## 2023-01-24 LAB — BRAIN NATRIURETIC PEPTIDE: B Natriuretic Peptide: 9.7 pg/mL (ref 0.0–100.0)

## 2023-01-24 LAB — URIC ACID: Uric Acid, Serum: 9.1 mg/dL — ABNORMAL HIGH (ref 3.7–8.6)

## 2023-01-24 MED ORDER — PREDNISONE 20 MG PO TABS
50.0000 mg | ORAL_TABLET | Freq: Once | ORAL | Status: AC
  Administered 2023-01-25: 50 mg via ORAL
  Filled 2023-01-24: qty 3

## 2023-01-24 MED ORDER — PREDNISONE 10 MG PO TABS: ORAL_TABLET | ORAL | 0 refills | Status: AC

## 2023-01-24 NOTE — ED Provider Notes (Signed)
[facility] Provider Note    Event Date/Time   First MD Initiated Contact with Patient 01/24/23 2057     (approximate)   History   Leg Pain (Patient C/O right lower extremity pain and swelling that began on Sunday. Patient does have a history of gout, and states that this very much feels like a flare up. He is unable to bear weight on his right foot at this time. )   HPI  Carlos Oneal is a 32 y.o. male past medical history significant for obesity, gout, CKD, who presents to the emergency department with lower leg pain.  States that he was recently getting over COVID and over the past couple of days feels like he is having a gout flare that is worse than his normal gout flares.  Significant pain from bilateral knees down to his feet.  Mild swelling to both of his feet.  Taking colchicine for the past 3 days.  Initially was not able to get out of bed secondary to the pain but states that he has been able to ambulate out of bed today.  Ongoing severe pain.  History of CKD.  Denies any falls or trauma.  Denies chest pain or shortness of breath.  No history of DVT or PE.     Physical Exam   Triage Vital Signs: ED Triage Vitals  Encounter Vitals Group     BP 01/24/23 2056 (!) 142/106     Systolic BP Percentile --      Diastolic BP Percentile --      Pulse Rate 01/24/23 2056 (!) 106     Resp 01/24/23 2056 18     Temp 01/24/23 2056 98.8 F (37.1 C)     Temp Source 01/24/23 2056 Oral     SpO2 01/24/23 2056 98 %     Weight 01/24/23 2056 (!) 390 lb (176.9 kg)     Height 01/24/23 2056 6\' 1"  (1.854 m)     Head Circumference --      Peak Flow --      Pain Score 01/24/23 2058 9     Pain Loc --      Pain Education --      Exclude from Growth Chart --     Most recent vital signs: Vitals:   01/24/23 2056  BP: (!) 142/106  Pulse: (!) 106  Resp: 18  Temp: 98.8 F (37.1 C)  SpO2: 98%    Physical Exam Constitutional:      Appearance: He is  well-developed. He is obese.  HENT:     Head: Atraumatic.  Eyes:     Conjunctiva/sclera: Conjunctivae normal.  Cardiovascular:     Rate and Rhythm: Regular rhythm.  Pulmonary:     Effort: No respiratory distress.  Musculoskeletal:     Cervical back: Normal range of motion.     Right lower leg: No edema.     Left lower leg: No edema.     Comments: Pitting edema to bilateral feet.  +2 DTRs that are equal bilaterally  Skin:    General: Skin is warm.     Capillary Refill: Capillary refill takes less than 2 seconds.  Neurological:     Mental Status: He is alert. Mental status is at baseline.      IMPRESSION / MDM / ASSESSMENT AND PLAN / ED COURSE  I reviewed the triage vital signs and the nursing notes.  Differential diagnosis including gout, rhabdomyolysis, heart failure, electrolyte abnormality.  Good reflexes  on exam, have a low suspicion for Guillain-Barr's.    Labs (all labs ordered are listed, but only abnormal results are displayed) Labs interpreted as -    Labs Reviewed  URIC ACID - Abnormal; Notable for the following components:      Result Value   Uric Acid, Serum 9.1 (*)    All other components within normal limits  BASIC METABOLIC PANEL - Abnormal; Notable for the following components:   BUN 21 (*)    Creatinine, Ser 1.79 (*)    GFR, Estimated 51 (*)    All other components within normal limits  CBC WITH DIFFERENTIAL/PLATELET  CK  MAGNESIUM  BRAIN NATRIURETIC PEPTIDE    No significant leukocytosis.  Creatinine is at his baseline.  No significant electrolyte abnormality.  No signs of rhabdomyolysis.  BNP within normal limits, no signs of heart failure.  Elevated uric acid level.  Patient was able to ambulate in the emergency department.  Lower leg pain consistent with gout.  Given his history of CKD will start the patient on prednisone taper.  No history of diabetes.  Discussed close follow-up with primary care physician and given return precautions.      PROCEDURES:  Critical Care performed: No  Procedures  Patient's presentation is most consistent with acute complicated illness / injury requiring diagnostic workup.   MEDICATIONS ORDERED IN ED: Medications  predniSONE (DELTASONE) tablet 50 mg (has no administration in time range)    FINAL CLINICAL IMPRESSION(S) / ED DIAGNOSES   Final diagnoses:  Acute gout of multiple sites, unspecified cause     Rx / DC Orders   ED Discharge Orders          Ordered    predniSONE (DELTASONE) 10 MG tablet  Daily        01/24/23 2357             Note:  This document was prepared using Dragon voice recognition software and may include unintentional dictation errors.   Corena Herter, MD 01/25/23 0000

## 2023-01-24 NOTE — ED Notes (Signed)
Pt was able to get up and walk around stretcher with a walker and get back in bed.  Pt had some difficulty but was able to do this independently.  Pt reports that he uses a walker at home and uses this to get around in his home.

## 2023-01-24 NOTE — Discharge Instructions (Signed)
Prednisone -you are given a prescription for a steroid.  It is important that you take this medication with food.  This medication can cause an upset stomach.  It also can increase your glucose if you have a history of diabetes, so it is important that you check your glucose frequently while you are on this medication.  You can take Tylenol 1000 mg every 6 hours for pain control.  Continue to use your colchicine as directed with your primary care provider.

## 2023-07-06 ENCOUNTER — Encounter: Payer: Self-pay | Admitting: Emergency Medicine

## 2023-07-06 ENCOUNTER — Ambulatory Visit (INDEPENDENT_AMBULATORY_CARE_PROVIDER_SITE_OTHER): Payer: 59

## 2023-07-06 ENCOUNTER — Ambulatory Visit
Admission: EM | Admit: 2023-07-06 | Discharge: 2023-07-06 | Disposition: A | Discharge status: Home / Self Care | Payer: 59 | Attending: Family Medicine | Admitting: Family Medicine

## 2023-07-06 DIAGNOSIS — M109 Gout, unspecified: Secondary | ICD-10-CM

## 2023-07-06 DIAGNOSIS — Z91148 Patient's other noncompliance with medication regimen for other reason: Secondary | ICD-10-CM | POA: Diagnosis not present

## 2023-07-06 DIAGNOSIS — M25571 Pain in right ankle and joints of right foot: Secondary | ICD-10-CM | POA: Diagnosis not present

## 2023-07-06 DIAGNOSIS — I10 Essential (primary) hypertension: Secondary | ICD-10-CM | POA: Diagnosis not present

## 2023-07-06 MED ORDER — COLCHICINE 0.6 MG PO TABS: ORAL_TABLET | ORAL | 0 refills | Status: AC

## 2023-07-06 MED ORDER — PREDNISONE 10 MG (21) PO TBPK: ORAL_TABLET | Freq: Every day | ORAL | 0 refills | Status: AC

## 2023-07-06 MED ORDER — DEXAMETHASONE SODIUM PHOSPHATE 10 MG/ML IJ SOLN
10.0000 mg | Freq: Once | INTRAMUSCULAR | Status: AC
  Administered 2023-07-06: 10 mg via INTRAMUSCULAR

## 2023-07-06 NOTE — Discharge Instructions (Addendum)
 It is important that you take your blood pressure medicine daily.  Hold your allopurinol until you complete your treatment for gout then restart after completing your treatment.  Follow-up with your primary care doctor about your abnormal kidney function.  Work on m.d.c. holdings and exercise as discussed.  You have some arthritic changes that are not new and a bone spur in your heel.  No fractures or dislocated bones seen.  Follow-up with your podiatrist.  Stop by the pharmacy to pick up your colchicine .

## 2023-07-06 NOTE — ED Provider Notes (Signed)
 [facility]    CSN: [id number] Arrival date & time: 07/06/23  1903      History   Chief Complaint Chief Complaint  Patient presents with   Ankle Pain    right    HPI  HPI Carlos Oneal is a 33 y.o. male.   Carlos Oneal presents for right ankle pain with swelling that started 3-4  days.  When he walks his legs feel very heavy.  Has been feeling somewhat dizzy.  He goggled his symptoms and was concerned he had a DVT.  No leg swelling. Denies recent alcohol use, shrimp and beef.  Did eat some chicken wings. Has sharp pain with walking. Has been wearing hard soled shoes to help with foot pain. Has history of gout.      Past Medical History:  Diagnosis Date   Gout    Hypertension    Seasonal allergies     There are no active problems to display for this patient.   Past Surgical History:  Procedure Laterality Date   WISDOM TOOTH EXTRACTION         Home Medications    Prior to Admission medications   Medication Sig Start Date End Date Taking? Authorizing Provider  allopurinol (ZYLOPRIM) 100 MG tablet Take 100 mg by mouth daily. 05/16/20  Yes [provider]  amLODipine (NORVASC) 10 MG tablet Take 10 mg by mouth daily.   Yes [provider]  colchicine  0.6 MG tablet Take 2 tablets then an hour later take another tablet. The next day take one tablet daily until complete. 07/06/23  Yes Ashley Bultema, DO  predniSONE  (STERAPRED UNI-PAK 21 TAB) 10 MG (21) TBPK tablet Take by mouth daily. Take 6 tabs by mouth daily for 1, then 5 tabs for 1 day, then 4 tabs for 1 day, then 3 tabs for 1 day, then 2 tabs for 1 day, then 1 tab for 1 day. 07/07/23  Yes Saraiah Bhat, DO  gabapentin  (NEURONTIN ) 100 MG capsule Take 1 capsule (100 mg total) by mouth 3 (three) times daily. 01/02/23 01/02/24  Bradler, Evan K, MD  cetirizine  (ZYRTEC  ALLERGY) 10 MG tablet Take 1 tablet (10 mg total) by mouth daily. 01/28/17 05/04/19  Lacinda Elsie SQUIBB, PA-C    Family  History Family History  Problem Relation Age of Onset   Healthy Mother    Hypertension Father    Deep vein thrombosis Father     Social History Social History   Tobacco Use   Smoking status: Never   Smokeless tobacco: Never  Vaping Use   Vaping status: Never Used  Substance Use Topics   Alcohol use: Yes    Comment: occasional   Drug use: No     Allergies   Bee venom   Review of Systems Review of Systems: :negative unless otherwise stated in HPI.      Physical Exam Triage Vital Signs ED Triage Vitals  Encounter Vitals Group     BP      Systolic BP Percentile      Diastolic BP Percentile      Pulse      Resp      Temp      Temp src      SpO2      Weight      Height      Head Circumference      Peak Flow      Pain Score      Pain Loc  Pain Education      Exclude from Growth Chart    No data found.  Updated Vital Signs BP (!) 153/108 (BP Location: Left Arm)   Pulse 89   Temp 99 F (37.2 C) (Oral)   Resp 18   Ht 6' 1 (1.854 m)   Wt (!) 176.9 kg   SpO2 97%   BMI 51.45 kg/m   Visual Acuity Right Eye Distance:   Left Eye Distance:   Bilateral Distance:    Right Eye Near:   Left Eye Near:    Bilateral Near:     Physical Exam GEN: well appearing male in no acute distress  CVS: well perfused  RESP: speaking in full sentences without pause, no respiratory distress  MSK:   Ankle/Foot, Right: TTP noted at the lateral malleolus. Mild visible erythema with dorsal swelling. No ecchymosis, or bony deformity. Notable pes planus deformity bilaterally. Transverse arch grossly intact;  Range of motion is full in all directions. Strength is 5/5 in all directions. No tenderness at the insertion/body/myotendinous junction of the Achilles tendon; No tenderness on posterior aspects of medial malleolus; Talar dome non tender; Unremarkable  calcaneal squeeze; No tenderness over the navicular prominence or  over cuboid; No pain at base of 5th MT; No tenderness  at the distal metatarsals; Able to walk 4 steps but with pain      UC Treatments / Results  Labs (all labs ordered are listed, but only abnormal results are displayed) Labs Reviewed - No data to display  EKG   Radiology DG Ankle Complete Right Result Date: 07/06/2023 CLINICAL DATA:  Right foot and lateral ankle pain, swelling EXAM: RIGHT ANKLE - COMPLETE 3+ VIEW COMPARISON:  None Available. FINDINGS: Diffuse soft tissue swelling. Posterior calcaneal spurs. No acute bony abnormality. Specifically, no fracture, subluxation, or dislocation. 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 IMPRESSION: No acute bony abnormality. Electronically Signed   By: Franky Crease M.D.   On: 07/06/2023 20:29      Procedures Procedures (including critical care time)  Medications Ordered in UC Medications  dexamethasone  (DECADRON ) injection 10 mg (10 mg Intramuscular Given 07/06/23 2009)    Initial Impression / Assessment and Plan / UC Course  I have reviewed the triage vital signs and the nursing notes.  Pertinent labs & imaging results that were available during my care of the patient were reviewed by me and considered in my medical decision making (see chart for details).      Pt is a 33 y.o.  male with 4 days of right ankle pain without known injury. On exam, pt has dorsal pedal edema and tenderness at lateral malleolus concerning for fracture, gout vs arthritis.   Obtained right ankle plain films.  Personally interpreted by me were unremarkable for fracture or dislocation. Radiologist report reviewed and additionally notes arthritic changes in the hindfoot and midfoot.  And posterior bone spurs. I suspect an acute gout flare as pt has tenderness to light palpation.  Decadron  10 mg IM given.   Patient to gradually return to normal activities, as tolerated and continue ordinary activities within the limits permitted by pain. Prescribed prednisone  and colchicine   for pain relief.  Tylenol  PRN. Advised  patient to avoid OTC NSAIDs while taking prescription NSAID.  Patient to follow up with primary care to discuss his elevated blood pressures. He is hypertensive here at 153/103. Per chart review, he is prescribed amlodipine 10 mg but has not been taking it.  Denies needing a refill.  Return and ED precautions given. Understanding voiced. Discussed MDM, treatment plan and plan for follow-up with patient/parent who agrees with plan.   Final Clinical Impressions(s) / UC Diagnoses   Final diagnoses:  Acute right ankle pain  Elevated blood pressure reading with diagnosis of hypertension  Acute gout of right ankle, unspecified cause  Nonadherence to medication     Discharge Instructions      It is important that you take your blood pressure medicine daily.  Hold your allopurinol until you complete your treatment for gout then restart after completing your treatment.  Follow-up with your primary care doctor about your abnormal kidney function.  Work on m[employer] holdings and exercise as discussed.  You have some arthritic changes that are not new and a bone spur in your heel.  No fractures or dislocated bones seen.  Follow-up with your podiatrist.  Stop by the pharmacy to pick up your colchicine .     ED Prescriptions     Medication Sig Dispense Auth. Provider   colchicine  0.6 MG tablet Take 2 tablets then an hour later take another tablet. The next day take one tablet daily until complete. 9 tablet Raquelle Pietro, DO   predniSONE  (STERAPRED UNI-PAK 21 TAB) 10 MG (21) TBPK tablet Take by mouth daily. Take 6 tabs by mouth daily for 1, then 5 tabs for 1 day, then 4 tabs for 1 day, then 3 tabs for 1 day, then 2 tabs for 1 day, then 1 tab for 1 day. 21 tablet Wylma Tatem, DO      PDMP not reviewed this encounter.   Roena Sassaman, DO 07/12/23 1706

## 2023-07-06 NOTE — ED Triage Notes (Signed)
 Pt c/o right foot, and ankle pain and swelling. Started a week ago. He states his knees feel heavy and painful. Pt states he h/o gout but he states this does not feel like gout. Denies injury.

## 2023-08-01 IMAGING — CR DG KNEE COMPLETE 4+V*L*
5 series · 5 of 5 positions shown · non-contrast
Comparison: None

CLINICAL DATA: Pain and swelling to LEFT knee for 3 weeks,
increased pain since yesterday, slow gait, most pain adjacent to
lateral patella

EXAM:
LEFT KNEE - COMPLETE 4+ VIEW

[knee ap]
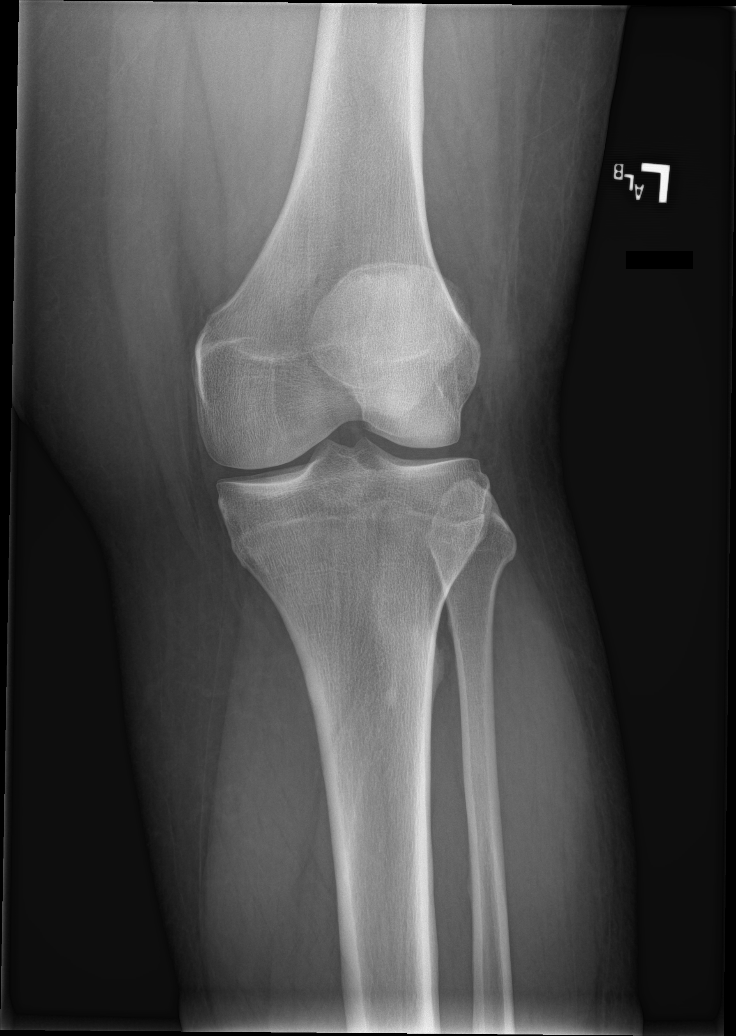

[knee lat (1 of 2)]
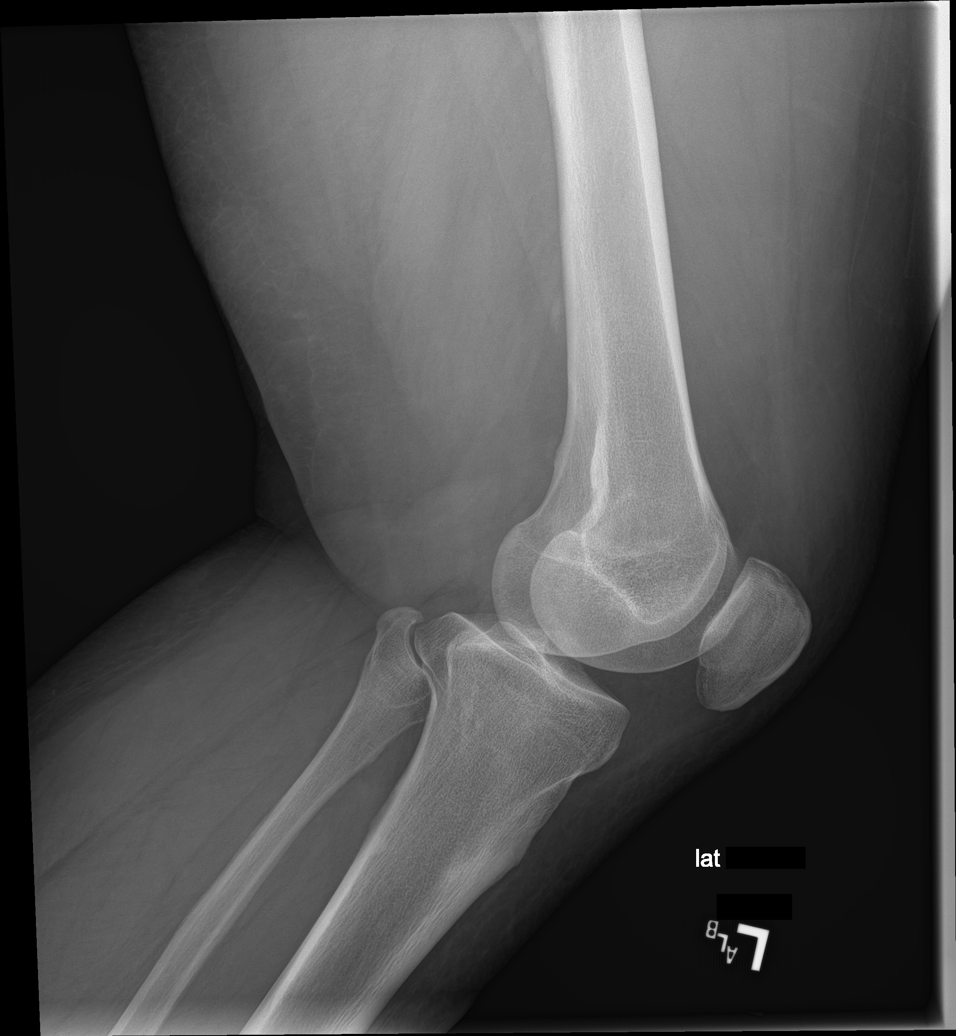

[tunnel]
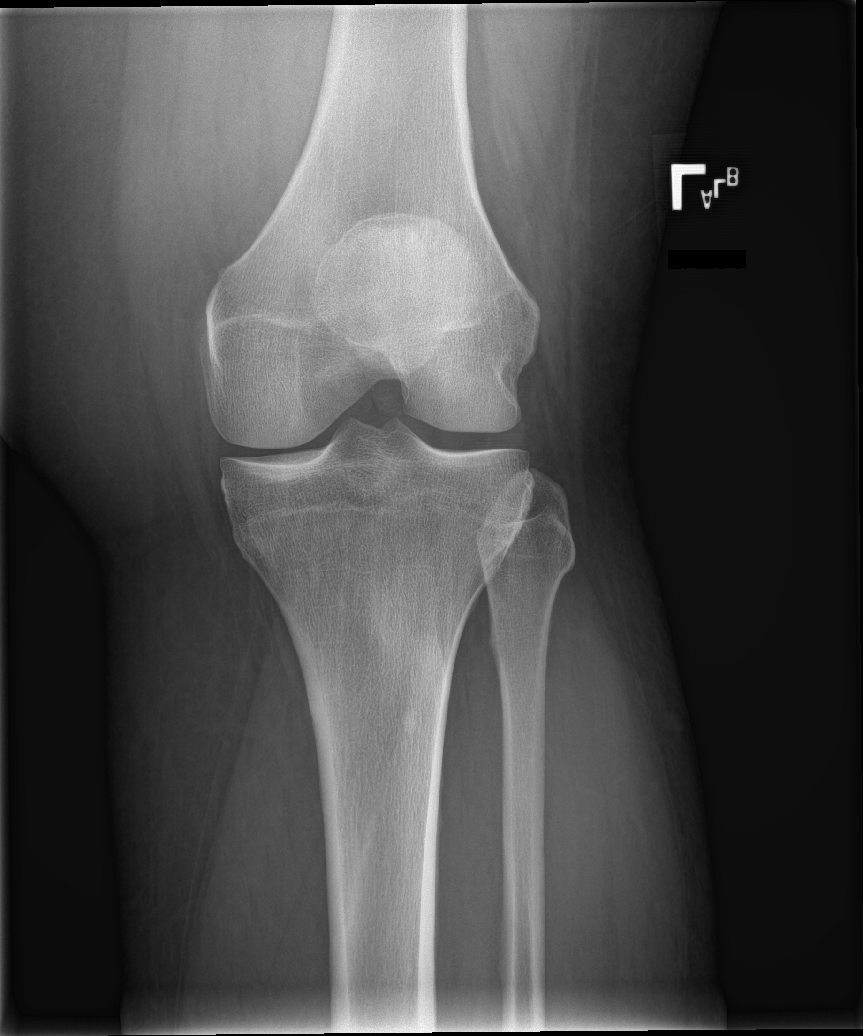

[patella skyline]
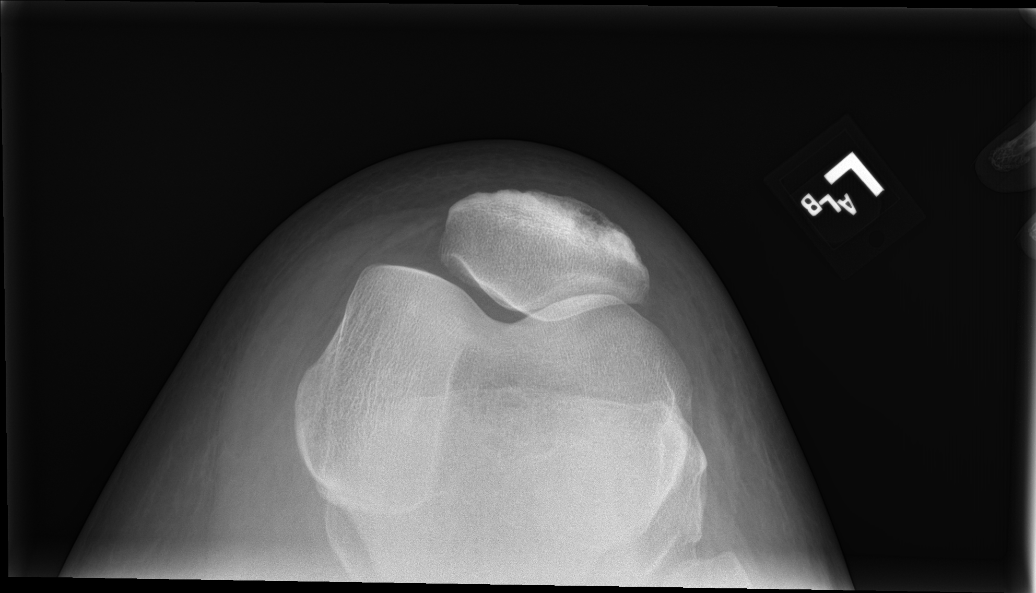

[knee lat (2 of 2)]
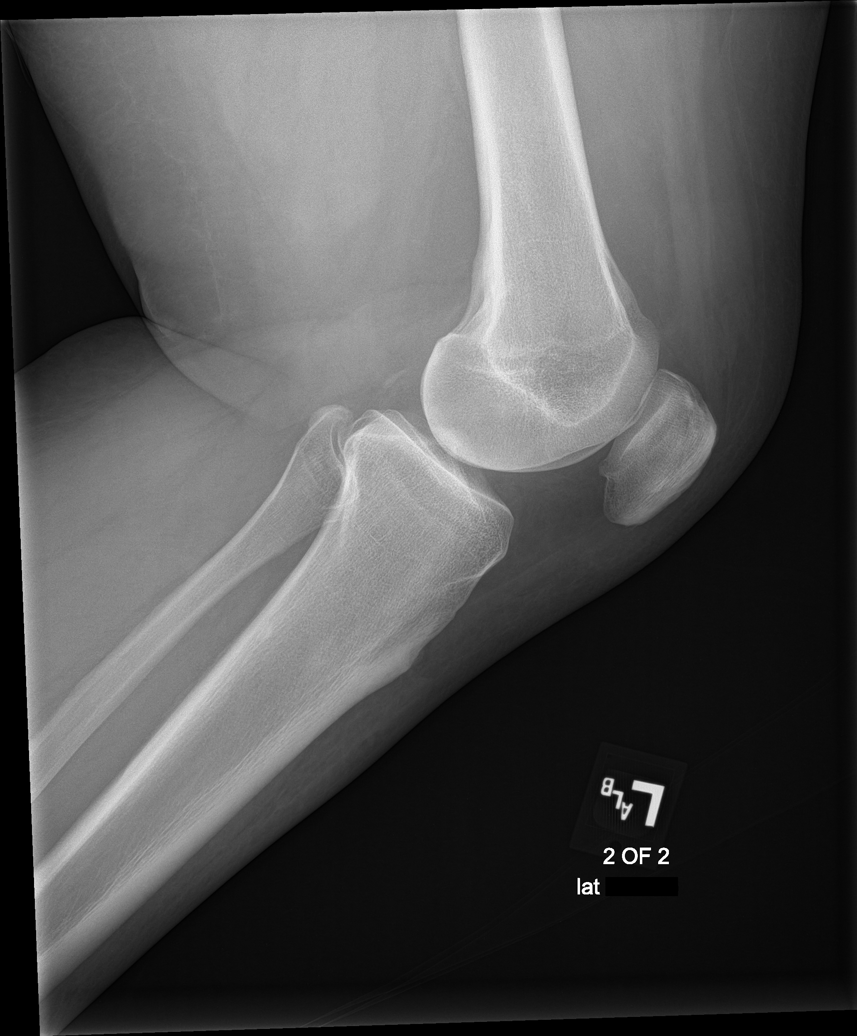

[5 of 5 positions shown; findings below may reference images not displayed]

FINDINGS: Osseous mineralization normal.

Joint spaces preserved.

No acute fracture, dislocation, or bone destruction.

No joint effusion.
IMPRESSION: Normal exam.

## 2023-08-13 ENCOUNTER — Other Ambulatory Visit (HOSPITAL_COMMUNITY): Payer: Self-pay | Admitting: Family Medicine

## 2023-08-13 ENCOUNTER — Emergency Department

## 2023-08-13 ENCOUNTER — Other Ambulatory Visit: Payer: Self-pay

## 2023-08-13 DIAGNOSIS — M79661 Pain in right lower leg: Secondary | ICD-10-CM | POA: Insufficient documentation

## 2023-08-13 DIAGNOSIS — M79604 Pain in right leg: Secondary | ICD-10-CM

## 2023-08-13 DIAGNOSIS — I10 Essential (primary) hypertension: Secondary | ICD-10-CM | POA: Diagnosis not present

## 2023-08-13 DIAGNOSIS — M79662 Pain in left lower leg: Secondary | ICD-10-CM | POA: Insufficient documentation

## 2023-08-13 LAB — CBC WITH DIFFERENTIAL/PLATELET
Abs Immature Granulocytes: 0.04 10*3/uL (ref 0.00–0.07)
Basophils Absolute: 0.1 10*3/uL (ref 0.0–0.1)
Basophils Relative: 1 %
Eosinophils Absolute: 0.3 10*3/uL (ref 0.0–0.5)
Eosinophils Relative: 3 %
HCT: 49.6 % (ref 39.0–52.0)
Hemoglobin: 15.6 g/dL (ref 13.0–17.0)
Immature Granulocytes: 0 %
Lymphocytes Relative: 53 %
Lymphs Abs: 5.4 10*3/uL — ABNORMAL HIGH (ref 0.7–4.0)
MCH: 27.9 pg (ref 26.0–34.0)
MCHC: 31.5 g/dL (ref 30.0–36.0)
MCV: 88.7 fL (ref 80.0–100.0)
Monocytes Absolute: 0.6 10*3/uL (ref 0.1–1.0)
Monocytes Relative: 6 %
Neutro Abs: 3.8 10*3/uL (ref 1.7–7.7)
Neutrophils Relative %: 37 %
Platelets: 334 10*3/uL (ref 150–400)
RBC: 5.59 MIL/uL (ref 4.22–5.81)
RDW: 13.4 % (ref 11.5–15.5)
Smear Review: NORMAL
WBC: 10.2 10*3/uL (ref 4.0–10.5)
nRBC: 0 % (ref 0.0–0.2)

## 2023-08-13 LAB — BASIC METABOLIC PANEL
Anion gap: 13 (ref 5–15)
BUN: 20 mg/dL (ref 6–20)
CO2: 19 mmol/L — ABNORMAL LOW (ref 22–32)
Calcium: 9.2 mg/dL (ref 8.9–10.3)
Chloride: 106 mmol/L (ref 98–111)
Creatinine, Ser: 1.61 mg/dL — ABNORMAL HIGH (ref 0.61–1.24)
GFR, Estimated: 58 mL/min — ABNORMAL LOW (ref >60)
Glucose, Bld: 124 mg/dL — ABNORMAL HIGH (ref 70–99)
Potassium: 4.1 mmol/L (ref 3.5–5.1)
Sodium: 138 mmol/L (ref 135–145)

## 2023-08-13 LAB — D-DIMER, QUANTITATIVE: D-Dimer, Quant: 0.56 ug{FEU}/mL — ABNORMAL HIGH (ref 0.00–0.50)

## 2023-08-13 NOTE — ED Triage Notes (Signed)
 Pt reports he was seen at his PCP today and had blood work done and was told his d dimer was elevated. Pt states he has had bilateral calf pain x2 months. Pt denies chest pain or shortness of breath.

## 2023-08-14 ENCOUNTER — Emergency Department
Admission: EM | Admit: 2023-08-14 | Discharge: 2023-08-14 | Disposition: A | Discharge status: Home / Self Care | Attending: Emergency Medicine | Admitting: Emergency Medicine

## 2023-08-14 DIAGNOSIS — M7989 Other specified soft tissue disorders: Secondary | ICD-10-CM

## 2023-08-14 MED ORDER — KETOROLAC TROMETHAMINE 30 MG/ML IJ SOLN
30.0000 mg | Freq: Once | INTRAMUSCULAR | Status: AC
  Administered 2023-08-14: 30 mg via INTRAMUSCULAR
  Filled 2023-08-14: qty 1

## 2023-08-14 NOTE — ED Provider Notes (Signed)
 [facility] Provider Note    Event Date/Time   First MD Initiated Contact with Patient 08/14/23 0037     (approximate)   History   Abnormal Lab   HPI  Carlos Oneal is a 33 y.o. male   Past medical history of hypertension and gout who presents to the Emergency Department with bilateral calf pain which is longstanding and evaluated by outpatient provider who ordered a D-dimer which was elevated and sent in for ultrasound.  He works as a Naval [employer].  He has no respiratory complaints or chest pain.  No other acute medical complaints.     External Medical Documents Reviewed: Urgent care note from February 2025 with ankle pain and swelling treated for gout      Physical Exam   Triage Vital Signs: ED Triage Vitals  Encounter Vitals Group     BP 08/13/23 2015 (!) 162/105     Systolic BP Percentile --      Diastolic BP Percentile --      Pulse Rate 08/13/23 2015 92     Resp 08/13/23 2015 20     Temp 08/13/23 2015 98.5 F (36.9 C)     Temp Source 08/13/23 2015 Oral     SpO2 08/13/23 2015 96 %     Weight 08/13/23 2014 (!) 367 lb (166.5 kg)     Height 08/13/23 2014 6\' 1"  (1.854 m)     Head Circumference --      Peak Flow --      Pain Score 08/13/23 2014 8     Pain Loc --      Pain Education --      Exclude from Growth Chart --     Most recent vital signs: Vitals:   08/14/23 0149 08/14/23 0152  BP:  (!) 147/108  Pulse: 63 63  Resp:  18  Temp:  98.4 F (36.9 C)  SpO2: 99% 99%    General: Awake, no distress.  CV:  Good peripheral perfusion.  Resp:  Normal effort.  Abd:  No distention.  Other:  Bilateral calf swelling and mild pain to palpation, equal in both sides.  Neurovascular intact.   ED Results / Procedures / Treatments   Labs (all labs ordered are listed, but only abnormal results are displayed) Labs Reviewed  CBC WITH DIFFERENTIAL/PLATELET - Abnormal; Notable for the following components:      Result Value   Lymphs  Abs 5.4 (*)    All other components within normal limits  BASIC METABOLIC PANEL - Abnormal; Notable for the following components:   CO2 19 (*)    Glucose, Bld 124 (*)    Creatinine, Ser 1.61 (*)    GFR, Estimated 58 (*)    All other components within normal limits  D-DIMER, QUANTITATIVE - Abnormal; Notable for the following components:   D-Dimer, Quant 0.56 (*)    All other components within normal limits     I ordered and reviewed the above labs they are notable for D-dimer was elevated at 0.56.   RADIOLOGY I independently reviewed and interpreted ultrasound of the legs and see no obvious DVT I also reviewed radiologist's formal read.   PROCEDURES:  Critical Care performed: No  Procedures   MEDICATIONS ORDERED IN ED: Medications  ketorolac (TORADOL) 30 MG/ML injection 30 mg (30 mg Intramuscular Given 08/14/23 0148)    IMPRESSION / MDM / ASSESSMENT AND PLAN / ED COURSE  I reviewed the triage vital signs and the nursing notes.  Patient's presentation is most consistent with acute presentation with potential threat to life or bodily function.  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  Fortunately no DVT identified.  Warm well-perfused leg with good pulses doubt ischemia.  He will follow-up with his primary doctor.       FINAL CLINICAL IMPRESSION(S) / ED DIAGNOSES   Final diagnoses:  Leg swelling     Rx / DC Orders   ED Discharge Orders     None        Note:  This document was prepared using Dragon voice recognition software and may include unintentional dictation errors.    Pilar Jarvis, MD 08/14/23 0900

## 2023-08-14 NOTE — Discharge Instructions (Addendum)
 Take acetaminophen 650 mg and ibuprofen 400 mg every 6 hours for pain.  Take with food.  Thank you for choosing [place] for your health care today!  Please see your primary doctor this week for a follow up appointment.   If you have any new, worsening, or unexpected symptoms call your doctor right away or come back to the emergency department for reevaluation.  It was my pleasure to care for you today.   Daneil Dan Modesto Charon, MD

## 2023-08-24 ENCOUNTER — Ambulatory Visit (HOSPITAL_COMMUNITY)

## 2023-08-24 ENCOUNTER — Encounter (HOSPITAL_COMMUNITY): Payer: Self-pay

## 2023-09-06 IMAGING — US US EXTREM LOW VENOUS*L*
1 series · 13 of 24 positions shown · non-contrast
Comparison: None.

CLINICAL DATA: leg swelling



[Series 1: us venous img lower uni left (dvt) · portal-venous · 13 of 39 slices shown]
[im 1/39]
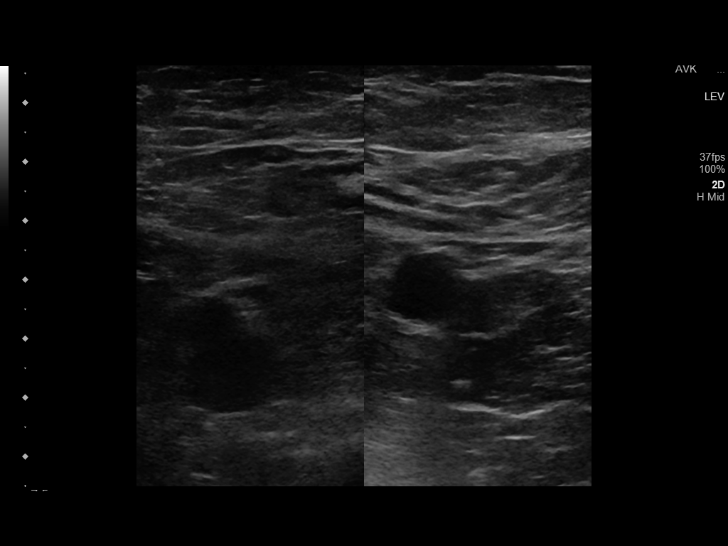
[im 4/39]
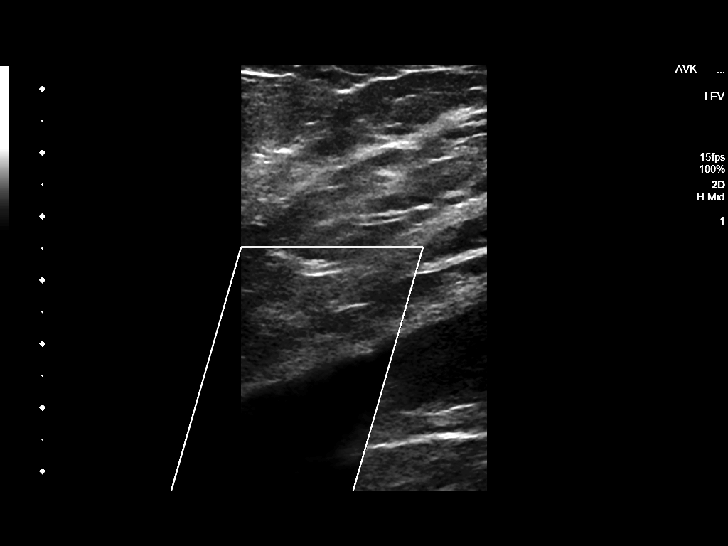
[im 7/39]
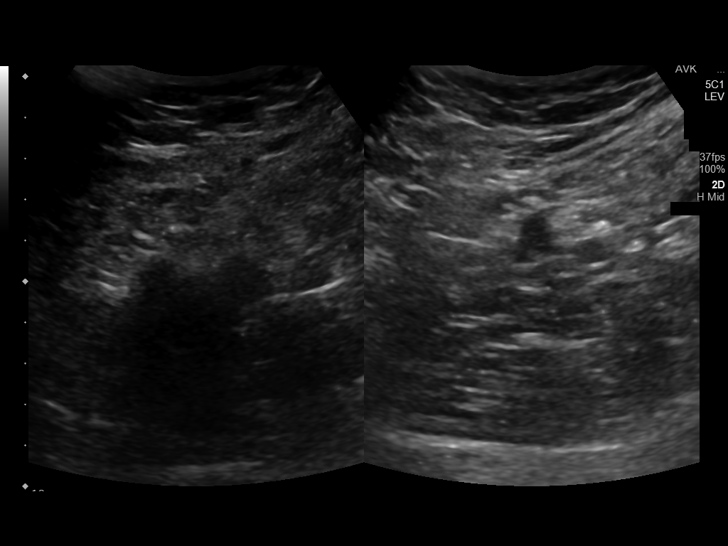
[im 10/39]
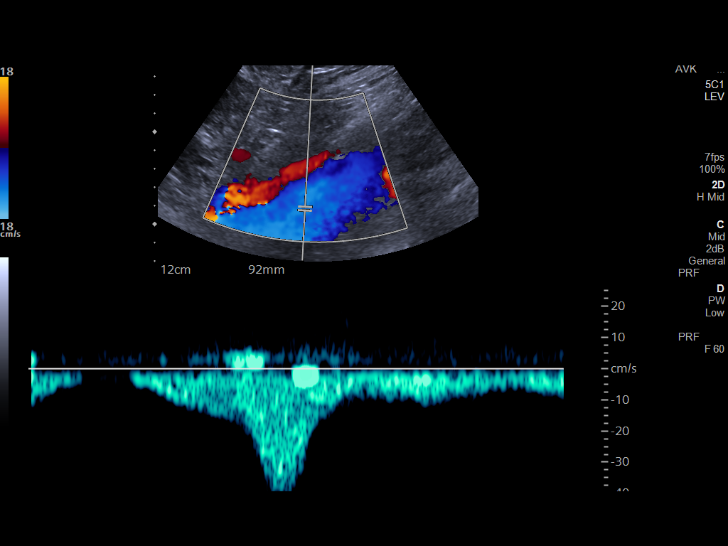
[im 14/39]
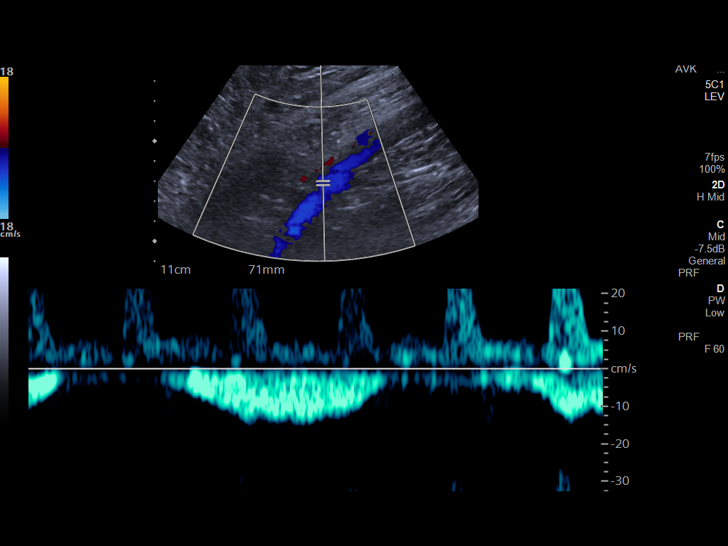
[im 17/39]
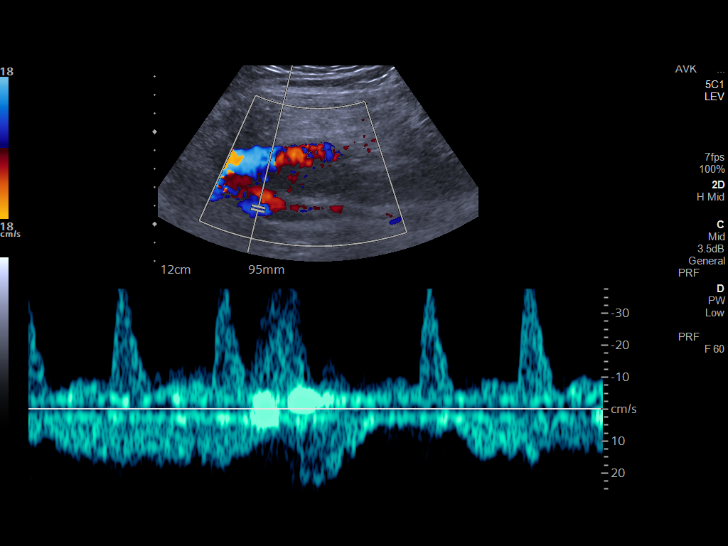
[im 20/39]
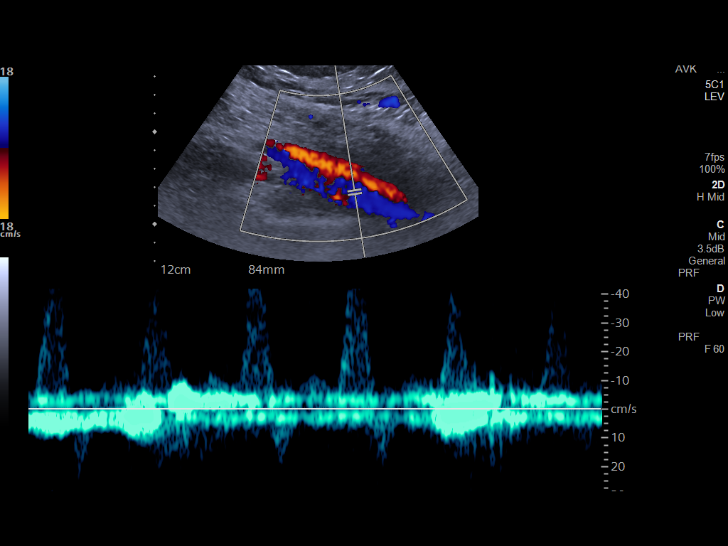
[im 22/39]
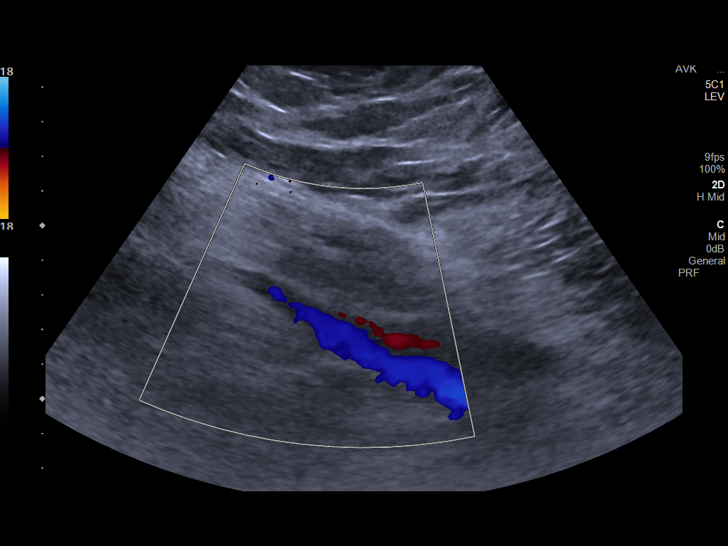
[im 25/39]
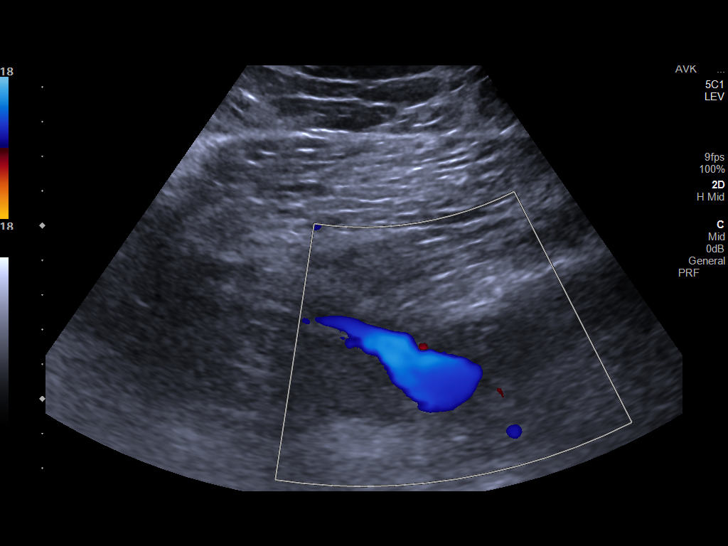
[im 29/39]
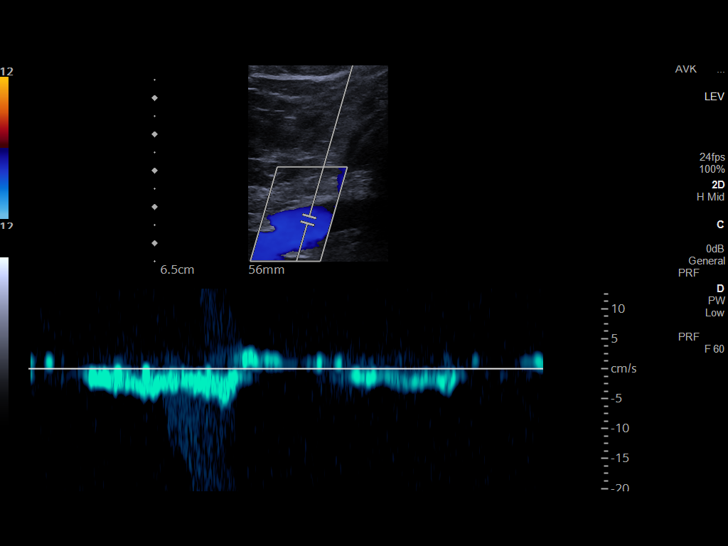
[im 32/39]
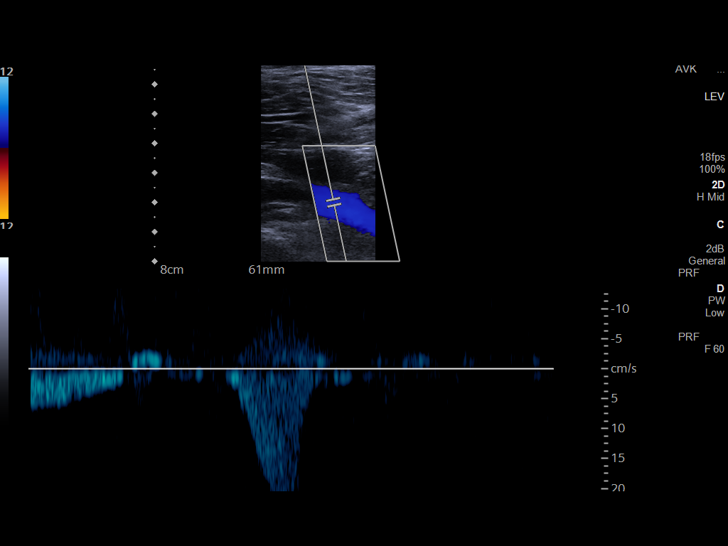
[im 35/39]
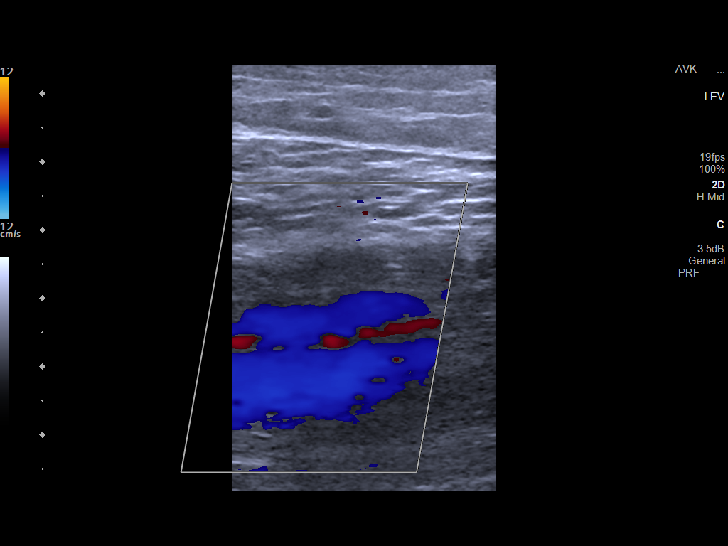
[im 39/39]
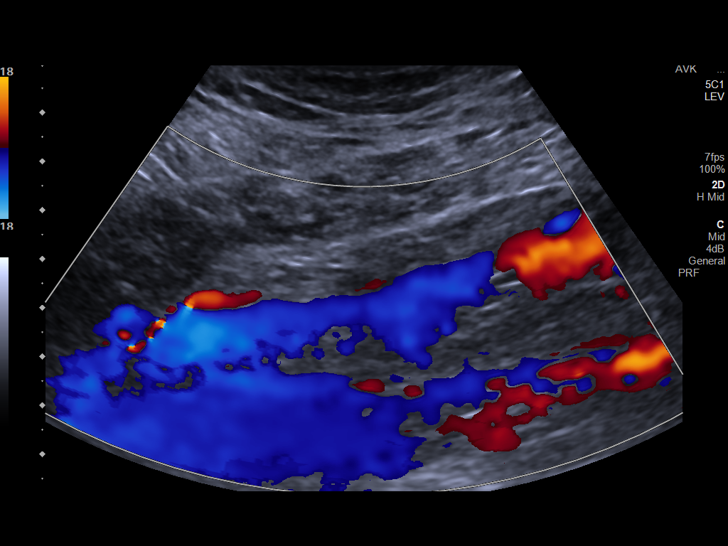

[13 of 24 positions shown; findings below may reference images not displayed]

FINDINGS: Contralateral Common Femoral Vein: Respiratory phasicity is normal
and symmetric with the symptomatic side. No evidence of thrombus.
Normal compressibility.

Common Femoral Vein: No evidence of thrombus. Normal
compressibility, respiratory phasicity and response to augmentation.

Saphenofemoral Junction: No evidence of thrombus. Normal
compressibility and flow on color Doppler imaging.

Profunda Femoral Vein: No evidence of thrombus. Normal
compressibility and flow on color Doppler imaging.

Femoral Vein: No evidence of thrombus. Normal compressibility,
respiratory phasicity and response to augmentation.

Popliteal Vein: No evidence of thrombus. Normal compressibility,
respiratory phasicity and response to augmentation.

Calf Veins: No evidence of thrombus. Normal compressibility and flow
on color Doppler imaging.

Other Findings:  None.
IMPRESSION: No evidence of deep venous thrombosis.

## 2024-05-10 ENCOUNTER — Ambulatory Visit
Admission: EM | Admit: 2024-05-10 | Discharge: 2024-05-10 | Disposition: A | Discharge status: Home / Self Care | Payer: Self-pay | Attending: Physician Assistant | Admitting: Physician Assistant

## 2024-05-10 DIAGNOSIS — H1032 Unspecified acute conjunctivitis, left eye: Secondary | ICD-10-CM

## 2024-05-10 DIAGNOSIS — J069 Acute upper respiratory infection, unspecified: Secondary | ICD-10-CM

## 2024-05-10 DIAGNOSIS — R051 Acute cough: Secondary | ICD-10-CM

## 2024-05-10 MED ORDER — IPRATROPIUM BROMIDE 0.06 % NA SOLN: 2.0000 | Freq: Four times a day (QID) | NASAL | 0 refills | Status: AC

## 2024-05-10 MED ORDER — PROMETHAZINE-DM 6.25-15 MG/5ML PO SYRP: 5.0000 mL | ORAL_SOLUTION | Freq: Four times a day (QID) | ORAL | 0 refills | Status: AC | PRN

## 2024-05-10 MED ORDER — CIPROFLOXACIN HCL 0.3 % OP SOLN: OPHTHALMIC | 0 refills | Status: AC

## 2024-05-10 NOTE — ED Provider Notes (Signed)
 [facility]    CSN: [id number] Arrival date & time: 05/10/24  1605      History   Chief Complaint Chief Complaint  Patient presents with   Cough   Eye Drainage    HPI Carlos Oneal is a 33 y.o. male.   HPI  Past Medical History:  Diagnosis Date   Gout    Hypertension    Seasonal allergies     There are no active problems to display for this patient.   Past Surgical History:  Procedure Laterality Date   WISDOM TOOTH EXTRACTION         Home Medications    Prior to Admission medications  Medication Sig Start Date End Date Taking? Authorizing Provider  ciprofloxacin  (CILOXAN ) 0.3 % ophthalmic solution Administer 1 drop to left eye, every 2 hours, while awake, for 2 days. Then 1 drop, every 4 hours, while awake, for the next 5 days. 05/10/24  Yes Arvis Huxley B, PA-C  ipratropium (ATROVENT ) 0.06 % nasal spray Place 2 sprays into both nostrils 4 (four) times daily. 05/10/24  Yes Arvis Huxley B, PA-C  promethazine -dextromethorphan (PROMETHAZINE -DM) 6.25-15 MG/5ML syrup Take 5 mLs by mouth 4 (four) times daily as needed. 05/10/24  Yes Arvis Huxley NOVAK, PA-C  allopurinol (ZYLOPRIM) 100 MG tablet Take 100 mg by mouth daily. 05/16/20   [provider]  amLODipine (NORVASC) 10 MG tablet Take 10 mg by mouth daily.    [provider]  colchicine  0.6 MG tablet Take 2 tablets then an hour later take another tablet. The next day take one tablet daily until complete. 07/06/23   Brimage, Vondra, DO  gabapentin  (NEURONTIN ) 100 MG capsule Take 1 capsule (100 mg total) by mouth 3 (three) times daily. 01/02/23 01/02/24  Bradler, Evan K, MD  predniSONE  (STERAPRED UNI-PAK 21 TAB) 10 MG (21) TBPK tablet Take by mouth daily. Take 6 tabs by mouth daily for 1, then 5 tabs for 1 day, then 4 tabs for 1 day, then 3 tabs for 1 day, then 2 tabs for 1 day, then 1 tab for 1 day. 07/07/23   Brimage, Vondra, DO  cetirizine  (ZYRTEC  ALLERGY) 10 MG tablet Take 1 tablet (10 mg  total) by mouth daily. 01/28/17 05/04/19  Lacinda Elsie SQUIBB, PA-C    Family History Family History  Problem Relation Age of Onset   Healthy Mother    Hypertension Father    Deep vein thrombosis Father     Social History Social History[1]   Allergies   Bee venom   Review of Systems Review of Systems  Constitutional:  Negative for fatigue and fever.  HENT:  Positive for congestion, rhinorrhea and sore throat. Negative for ear pain and sinus pain.   Eyes:  Positive for discharge, redness and itching. Negative for photophobia, pain and visual disturbance.  Respiratory:  Positive for cough. Negative for shortness of breath.   Neurological:  Negative for weakness and headaches.     Physical Exam Triage Vital Signs ED Triage Vitals  Encounter Vitals Group     BP      Girls Systolic BP Percentile      Girls Diastolic BP Percentile      Boys Systolic BP Percentile      Boys Diastolic BP Percentile      Pulse      Resp      Temp      Temp src      SpO2      Weight  Height      Head Circumference      Peak Flow      Pain Score      Pain Loc      Pain Education      Exclude from Growth Chart    No data found.  Updated Vital Signs BP (!) 181/137 (BP Location: Right Arm)   Pulse 100   Temp 98.1 F (36.7 C) (Oral)   Resp 18   Wt (!) 375 lb 14.4 oz (170.5 kg)   SpO2 94%   BMI 49.59 kg/m   Visual Acuity Right Eye Distance: 20/25 Left Eye Distance: 20/25 Bilateral Distance:    Physical Exam Vitals and nursing note reviewed.  Constitutional:      General: He is not in acute distress.    Appearance: Normal appearance. He is well-developed. He is not ill-appearing.  HENT:     Head: Normocephalic and atraumatic.     Right Ear: Tympanic membrane, ear canal and external ear normal.     Left Ear: Tympanic membrane, ear canal and external ear normal.     Nose: Congestion present.     Mouth/Throat:     Mouth: Mucous membranes are moist.     Pharynx: Oropharynx  is clear. Posterior oropharyngeal erythema present.  Eyes:     General: No scleral icterus.    Extraocular Movements: Extraocular movements intact.     Conjunctiva/sclera:     Left eye: Left conjunctiva is injected.     Pupils: Pupils are equal, round, and reactive to light.  Cardiovascular:     Rate and Rhythm: Normal rate and regular rhythm.  Pulmonary:     Effort: Pulmonary effort is normal. No respiratory distress.     Breath sounds: Normal breath sounds.  Musculoskeletal:     Cervical back: Neck supple.  Skin:    General: Skin is warm and dry.     Capillary Refill: Capillary refill takes less than 2 seconds.  Neurological:     General: No focal deficit present.     Mental Status: He is alert. Mental status is at baseline.     Motor: No weakness.     Gait: Gait normal.  Psychiatric:        Mood and Affect: Mood normal.        Behavior: Behavior normal.      UC Treatments / Results  Labs (all labs ordered are listed, but only abnormal results are displayed) Labs Reviewed - No data to display  EKG   Radiology No results found.  Procedures Procedures (including critical care time)  Medications Ordered in UC Medications - No data to display  Initial Impression / Assessment and Plan / UC Course  I have reviewed the triage vital signs and the nursing notes.  Pertinent labs & imaging results that were available during my care of the patient were reviewed by me and considered in my medical decision making (see chart for details).     *** Final Clinical Impressions(s) / UC Diagnoses   Final diagnoses:  Viral upper respiratory tract infection  Acute cough  Acute conjunctivitis of left eye, unspecified acute conjunctivitis type     Discharge Instructions      URI/COLD SYMPTOMS: Your exam today is consistent with a viral illness. Antibiotics are not indicated at this time. Use medications as directed, including cough syrup, nasal saline, and decongestants.  Your symptoms should improve over the next few days and resolve within 7-10 days. Increase rest and fluids.  F/u if symptoms worsen or predominate such as sore throat, ear pain, productive cough, shortness of breath, or if you develop high fevers or worsening fatigue over the next several days.       ED Prescriptions     Medication Sig Dispense Auth. Provider   promethazine -dextromethorphan (PROMETHAZINE -DM) 6.25-15 MG/5ML syrup Take 5 mLs by mouth 4 (four) times daily as needed. 118 mL Arvis Huxley B, PA-C   ipratropium (ATROVENT ) 0.06 % nasal spray Place 2 sprays into both nostrils 4 (four) times daily. 15 mL Arvis Huxley B, PA-C   ciprofloxacin  (CILOXAN ) 0.3 % ophthalmic solution Administer 1 drop to left eye, every 2 hours, while awake, for 2 days. Then 1 drop, every 4 hours, while awake, for the next 5 days. 5 mL Arvis Huxley NOVAK, PA-C      PDMP not reviewed this encounter.     [1]  Social History Tobacco Use   Smoking status: Never   Smokeless tobacco: Never  Vaping Use   Vaping status: Never Used  Substance Use Topics   Alcohol use: Yes    Comment: occasional   Drug use: No

## 2024-05-10 NOTE — ED Triage Notes (Signed)
 Pt present cough with congestion and throat pain, He also complains of left eye drainage and redness. Symptoms started for cold four days ago and eye issues this am

## 2024-05-10 NOTE — Discharge Instructions (Signed)
